# Patient Record
Sex: Female | Born: 1980
Health system: Southern US, Community
[De-identification: ages and names within clinical notes are randomized; demographics above are authoritative.]

## PROBLEM LIST (undated history)

## (undated) DIAGNOSIS — M1990 Unspecified osteoarthritis, unspecified site: Secondary | ICD-10-CM

## (undated) DIAGNOSIS — N809 Endometriosis, unspecified: Secondary | ICD-10-CM

## (undated) DIAGNOSIS — D649 Anemia, unspecified: Secondary | ICD-10-CM

## (undated) DIAGNOSIS — R102 Pelvic and perineal pain: Secondary | ICD-10-CM

## (undated) DIAGNOSIS — R112 Nausea with vomiting, unspecified: Secondary | ICD-10-CM

## (undated) DIAGNOSIS — E538 Deficiency of other specified B group vitamins: Secondary | ICD-10-CM

## (undated) DIAGNOSIS — G43909 Migraine, unspecified, not intractable, without status migrainosus: Secondary | ICD-10-CM

## (undated) DIAGNOSIS — Z9889 Other specified postprocedural states: Secondary | ICD-10-CM

## (undated) DIAGNOSIS — E559 Vitamin D deficiency, unspecified: Secondary | ICD-10-CM

## (undated) DIAGNOSIS — T7840XA Allergy, unspecified, initial encounter: Secondary | ICD-10-CM

## (undated) DIAGNOSIS — M6702 Short Achilles tendon (acquired), left ankle: Secondary | ICD-10-CM

## (undated) DIAGNOSIS — F419 Anxiety disorder, unspecified: Secondary | ICD-10-CM

## (undated) DIAGNOSIS — M069 Rheumatoid arthritis, unspecified: Secondary | ICD-10-CM

## (undated) HISTORY — DX: Allergy, unspecified, initial encounter: T78.40XA

## (undated) HISTORY — DX: Vitamin D deficiency, unspecified: E55.9

## (undated) HISTORY — DX: Endometriosis, unspecified: N80.9

## (undated) HISTORY — DX: Deficiency of other specified B group vitamins: E53.8

## (undated) HISTORY — PX: DILATION AND CURETTAGE OF UTERUS: SHX78

## (undated) HISTORY — DX: Unspecified osteoarthritis, unspecified site: M19.90

---

## 2001-05-26 ENCOUNTER — Inpatient Hospital Stay (HOSPITAL_COMMUNITY): Admission: AD | Admit: 2001-05-26 | Discharge: 2001-05-26 | Payer: Self-pay | Admitting: Obstetrics

## 2001-05-26 ENCOUNTER — Encounter: Payer: Self-pay | Admitting: Obstetrics

## 2001-05-28 ENCOUNTER — Inpatient Hospital Stay (HOSPITAL_COMMUNITY): Admission: AD | Admit: 2001-05-28 | Discharge: 2001-05-28 | Payer: Self-pay | Admitting: Obstetrics

## 2001-05-29 ENCOUNTER — Inpatient Hospital Stay (HOSPITAL_COMMUNITY): Admission: AD | Admit: 2001-05-29 | Discharge: 2001-05-29 | Payer: Self-pay | Admitting: *Deleted

## 2001-06-05 ENCOUNTER — Inpatient Hospital Stay (HOSPITAL_COMMUNITY): Admission: AD | Admit: 2001-06-05 | Discharge: 2001-06-05 | Payer: Self-pay | Admitting: *Deleted

## 2001-06-05 ENCOUNTER — Encounter: Payer: Self-pay | Admitting: *Deleted

## 2001-06-07 ENCOUNTER — Inpatient Hospital Stay (HOSPITAL_COMMUNITY): Admission: AD | Admit: 2001-06-07 | Discharge: 2001-06-07 | Payer: Self-pay | Admitting: Obstetrics & Gynecology

## 2001-06-13 ENCOUNTER — Inpatient Hospital Stay (HOSPITAL_COMMUNITY): Admission: AD | Admit: 2001-06-13 | Discharge: 2001-06-13 | Payer: Self-pay | Admitting: Obstetrics

## 2001-10-16 ENCOUNTER — Encounter: Payer: Self-pay | Admitting: *Deleted

## 2001-10-16 ENCOUNTER — Inpatient Hospital Stay (HOSPITAL_COMMUNITY): Admission: AD | Admit: 2001-10-16 | Discharge: 2001-10-16 | Payer: Self-pay | Admitting: *Deleted

## 2001-10-18 ENCOUNTER — Inpatient Hospital Stay (HOSPITAL_COMMUNITY): Admission: AD | Admit: 2001-10-18 | Discharge: 2001-10-18 | Payer: Self-pay | Admitting: *Deleted

## 2002-05-23 ENCOUNTER — Other Ambulatory Visit: Admission: RE | Admit: 2002-05-23 | Discharge: 2002-05-23 | Payer: Self-pay | Admitting: Obstetrics and Gynecology

## 2002-05-23 ENCOUNTER — Other Ambulatory Visit: Admission: RE | Admit: 2002-05-23 | Discharge: 2002-05-23 | Payer: Self-pay | Admitting: *Deleted

## 2002-05-24 ENCOUNTER — Ambulatory Visit (HOSPITAL_COMMUNITY): Admission: RE | Admit: 2002-05-24 | Discharge: 2002-05-24 | Payer: Self-pay | Admitting: Obstetrics and Gynecology

## 2002-05-24 ENCOUNTER — Encounter: Payer: Self-pay | Admitting: Obstetrics and Gynecology

## 2002-06-19 ENCOUNTER — Encounter: Payer: Self-pay | Admitting: Obstetrics and Gynecology

## 2002-06-19 ENCOUNTER — Ambulatory Visit (HOSPITAL_COMMUNITY): Admission: RE | Admit: 2002-06-19 | Discharge: 2002-06-19 | Payer: Self-pay | Admitting: Obstetrics and Gynecology

## 2002-07-22 ENCOUNTER — Ambulatory Visit (HOSPITAL_COMMUNITY): Admission: RE | Admit: 2002-07-22 | Discharge: 2002-07-22 | Payer: Self-pay | Admitting: Obstetrics and Gynecology

## 2002-07-22 ENCOUNTER — Encounter: Payer: Self-pay | Admitting: Obstetrics and Gynecology

## 2002-09-19 ENCOUNTER — Ambulatory Visit (HOSPITAL_COMMUNITY): Admission: RE | Admit: 2002-09-19 | Discharge: 2002-09-19 | Payer: Self-pay | Admitting: Obstetrics and Gynecology

## 2002-09-19 ENCOUNTER — Encounter: Payer: Self-pay | Admitting: Obstetrics and Gynecology

## 2002-11-15 ENCOUNTER — Encounter: Payer: Self-pay | Admitting: Obstetrics and Gynecology

## 2002-11-15 ENCOUNTER — Encounter (INDEPENDENT_AMBULATORY_CARE_PROVIDER_SITE_OTHER): Payer: Self-pay | Admitting: *Deleted

## 2002-11-15 ENCOUNTER — Inpatient Hospital Stay (HOSPITAL_COMMUNITY): Admission: AD | Admit: 2002-11-15 | Discharge: 2002-11-18 | Payer: Self-pay | Admitting: Obstetrics and Gynecology

## 2004-05-31 ENCOUNTER — Ambulatory Visit: Payer: Self-pay | Admitting: Family Medicine

## 2004-06-02 ENCOUNTER — Ambulatory Visit: Payer: Self-pay | Admitting: Family Medicine

## 2004-07-08 ENCOUNTER — Other Ambulatory Visit: Admission: RE | Admit: 2004-07-08 | Discharge: 2004-07-08 | Payer: Self-pay | Admitting: Family Medicine

## 2004-07-08 ENCOUNTER — Ambulatory Visit: Payer: Self-pay | Admitting: Family Medicine

## 2004-09-07 ENCOUNTER — Ambulatory Visit: Payer: Self-pay | Admitting: Family Medicine

## 2004-09-13 ENCOUNTER — Inpatient Hospital Stay (HOSPITAL_COMMUNITY): Admission: AD | Admit: 2004-09-13 | Discharge: 2004-09-13 | Payer: Self-pay | Admitting: Obstetrics and Gynecology

## 2004-09-16 ENCOUNTER — Inpatient Hospital Stay (HOSPITAL_COMMUNITY): Admission: AD | Admit: 2004-09-16 | Discharge: 2004-09-16 | Payer: Self-pay | Admitting: Obstetrics and Gynecology

## 2004-09-20 ENCOUNTER — Inpatient Hospital Stay (HOSPITAL_COMMUNITY): Admission: AD | Admit: 2004-09-20 | Discharge: 2004-09-20 | Payer: Self-pay | Admitting: *Deleted

## 2005-03-01 ENCOUNTER — Ambulatory Visit (HOSPITAL_COMMUNITY): Admission: RE | Admit: 2005-03-01 | Discharge: 2005-03-01 | Payer: Self-pay | Admitting: Obstetrics and Gynecology

## 2005-04-07 ENCOUNTER — Inpatient Hospital Stay (HOSPITAL_COMMUNITY): Admission: AD | Admit: 2005-04-07 | Discharge: 2005-04-07 | Payer: Self-pay | Admitting: Obstetrics and Gynecology

## 2005-05-19 ENCOUNTER — Inpatient Hospital Stay (HOSPITAL_COMMUNITY): Admission: AD | Admit: 2005-05-19 | Discharge: 2005-05-21 | Payer: Self-pay | Admitting: Obstetrics and Gynecology

## 2005-06-29 ENCOUNTER — Other Ambulatory Visit: Admission: RE | Admit: 2005-06-29 | Discharge: 2005-06-29 | Payer: Self-pay | Admitting: Obstetrics and Gynecology

## 2005-07-27 ENCOUNTER — Ambulatory Visit (HOSPITAL_COMMUNITY): Admission: RE | Admit: 2005-07-27 | Discharge: 2005-07-27 | Payer: Self-pay | Admitting: Obstetrics and Gynecology

## 2005-07-27 HISTORY — PX: TUBAL LIGATION: SHX77

## 2007-09-13 ENCOUNTER — Emergency Department (HOSPITAL_COMMUNITY): Admission: EM | Admit: 2007-09-13 | Discharge: 2007-09-13 | Payer: Self-pay | Admitting: Family Medicine

## 2007-12-21 ENCOUNTER — Emergency Department (HOSPITAL_COMMUNITY): Admission: EM | Admit: 2007-12-21 | Discharge: 2007-12-21 | Payer: Self-pay | Admitting: Emergency Medicine

## 2008-01-28 ENCOUNTER — Encounter (INDEPENDENT_AMBULATORY_CARE_PROVIDER_SITE_OTHER): Payer: Self-pay | Admitting: Family Medicine

## 2008-01-28 ENCOUNTER — Ambulatory Visit: Payer: Self-pay | Admitting: Family Medicine

## 2008-01-28 DIAGNOSIS — A5901 Trichomonal vulvovaginitis: Secondary | ICD-10-CM | POA: Insufficient documentation

## 2008-01-28 LAB — CONVERTED CEMR LAB

## 2008-01-30 ENCOUNTER — Telehealth (INDEPENDENT_AMBULATORY_CARE_PROVIDER_SITE_OTHER): Payer: Self-pay | Admitting: Family Medicine

## 2008-02-13 ENCOUNTER — Encounter (INDEPENDENT_AMBULATORY_CARE_PROVIDER_SITE_OTHER): Payer: Self-pay | Admitting: Family Medicine

## 2008-02-28 ENCOUNTER — Encounter (INDEPENDENT_AMBULATORY_CARE_PROVIDER_SITE_OTHER): Payer: Self-pay | Admitting: Family Medicine

## 2008-02-28 ENCOUNTER — Emergency Department (HOSPITAL_COMMUNITY): Admission: EM | Admit: 2008-02-28 | Discharge: 2008-02-28 | Payer: Self-pay | Admitting: Family Medicine

## 2008-02-29 ENCOUNTER — Telehealth (INDEPENDENT_AMBULATORY_CARE_PROVIDER_SITE_OTHER): Payer: Self-pay | Admitting: Family Medicine

## 2008-03-06 ENCOUNTER — Encounter (INDEPENDENT_AMBULATORY_CARE_PROVIDER_SITE_OTHER): Payer: Self-pay | Admitting: Family Medicine

## 2008-03-07 ENCOUNTER — Emergency Department (HOSPITAL_COMMUNITY): Admission: EM | Admit: 2008-03-07 | Discharge: 2008-03-07 | Payer: Self-pay | Admitting: Family Medicine

## 2008-03-07 ENCOUNTER — Encounter (INDEPENDENT_AMBULATORY_CARE_PROVIDER_SITE_OTHER): Payer: Self-pay | Admitting: Family Medicine

## 2008-03-08 ENCOUNTER — Telehealth (INDEPENDENT_AMBULATORY_CARE_PROVIDER_SITE_OTHER): Payer: Self-pay | Admitting: Family Medicine

## 2008-03-12 ENCOUNTER — Ambulatory Visit: Payer: Self-pay | Admitting: Family Medicine

## 2008-03-12 ENCOUNTER — Telehealth (INDEPENDENT_AMBULATORY_CARE_PROVIDER_SITE_OTHER): Payer: Self-pay | Admitting: *Deleted

## 2008-03-12 DIAGNOSIS — M069 Rheumatoid arthritis, unspecified: Secondary | ICD-10-CM | POA: Insufficient documentation

## 2008-03-12 DIAGNOSIS — M25549 Pain in joints of unspecified hand: Secondary | ICD-10-CM | POA: Insufficient documentation

## 2008-03-17 ENCOUNTER — Ambulatory Visit (HOSPITAL_COMMUNITY): Admission: RE | Admit: 2008-03-17 | Discharge: 2008-03-17 | Payer: Self-pay | Admitting: Family Medicine

## 2008-03-18 ENCOUNTER — Encounter (INDEPENDENT_AMBULATORY_CARE_PROVIDER_SITE_OTHER): Payer: Self-pay | Admitting: Family Medicine

## 2008-03-19 ENCOUNTER — Telehealth (INDEPENDENT_AMBULATORY_CARE_PROVIDER_SITE_OTHER): Payer: Self-pay | Admitting: *Deleted

## 2008-03-19 ENCOUNTER — Encounter: Payer: Self-pay | Admitting: Family Medicine

## 2008-03-19 ENCOUNTER — Emergency Department (HOSPITAL_COMMUNITY): Admission: EM | Admit: 2008-03-19 | Discharge: 2008-03-19 | Payer: Self-pay | Admitting: Emergency Medicine

## 2008-03-26 ENCOUNTER — Telehealth (INDEPENDENT_AMBULATORY_CARE_PROVIDER_SITE_OTHER): Payer: Self-pay | Admitting: Family Medicine

## 2008-03-28 ENCOUNTER — Encounter (INDEPENDENT_AMBULATORY_CARE_PROVIDER_SITE_OTHER): Payer: Self-pay | Admitting: Family Medicine

## 2008-03-31 ENCOUNTER — Ambulatory Visit: Payer: Self-pay | Admitting: Family Medicine

## 2008-03-31 DIAGNOSIS — M25569 Pain in unspecified knee: Secondary | ICD-10-CM

## 2008-04-07 ENCOUNTER — Telehealth (INDEPENDENT_AMBULATORY_CARE_PROVIDER_SITE_OTHER): Payer: Self-pay | Admitting: Family Medicine

## 2008-04-10 LAB — CONVERTED CEMR LAB
ALT: 21 units/L (ref 0–35)
AST: 14 units/L (ref 0–37)
Alkaline Phosphatase: 85 units/L (ref 39–117)
BUN: 18 mg/dL (ref 6–23)
CO2: 22 meq/L (ref 19–32)
Calcium: 9.2 mg/dL (ref 8.4–10.5)
Creatinine, Ser: 0.58 mg/dL (ref 0.40–1.20)
Eosinophils Relative: 1 % (ref 0–5)
Glucose, Bld: 76 mg/dL (ref 70–99)
HCT: 35.6 % — ABNORMAL LOW (ref 36.0–46.0)
Hemoglobin: 10.6 g/dL — ABNORMAL LOW (ref 12.0–15.0)
Hep B S Ab: NEGATIVE
Hepatitis B Surface Ag: NEGATIVE
MCHC: 29.8 g/dL — ABNORMAL LOW (ref 30.0–36.0)
MCV: 81.1 fL (ref 78.0–100.0)
Neutro Abs: 9.7 10*3/uL — ABNORMAL HIGH (ref 1.7–7.7)
Neutrophils Relative %: 73 % (ref 43–77)
Platelets: 549 10*3/uL — ABNORMAL HIGH (ref 150–400)
Potassium: 4.9 meq/L (ref 3.5–5.3)
RBC: 4.39 M/uL (ref 3.87–5.11)
RDW: 14.2 % (ref 11.5–15.5)
WBC: 13.2 10*3/uL — ABNORMAL HIGH (ref 4.0–10.5)

## 2008-04-16 ENCOUNTER — Telehealth (INDEPENDENT_AMBULATORY_CARE_PROVIDER_SITE_OTHER): Payer: Self-pay | Admitting: Family Medicine

## 2008-04-28 ENCOUNTER — Encounter (INDEPENDENT_AMBULATORY_CARE_PROVIDER_SITE_OTHER): Payer: Self-pay | Admitting: Family Medicine

## 2008-06-30 ENCOUNTER — Encounter (INDEPENDENT_AMBULATORY_CARE_PROVIDER_SITE_OTHER): Payer: Self-pay | Admitting: Family Medicine

## 2008-09-02 ENCOUNTER — Encounter (INDEPENDENT_AMBULATORY_CARE_PROVIDER_SITE_OTHER): Payer: Self-pay | Admitting: Family Medicine

## 2008-09-09 IMAGING — CR DG HAND 3V BILAT
6 series · 6 of 6 positions shown · non-contrast
Comparison: None

CLINICAL DATA: Rheumatoid arthritis

BILATERAL HAND - 3 VIEW

[x hand pa left]
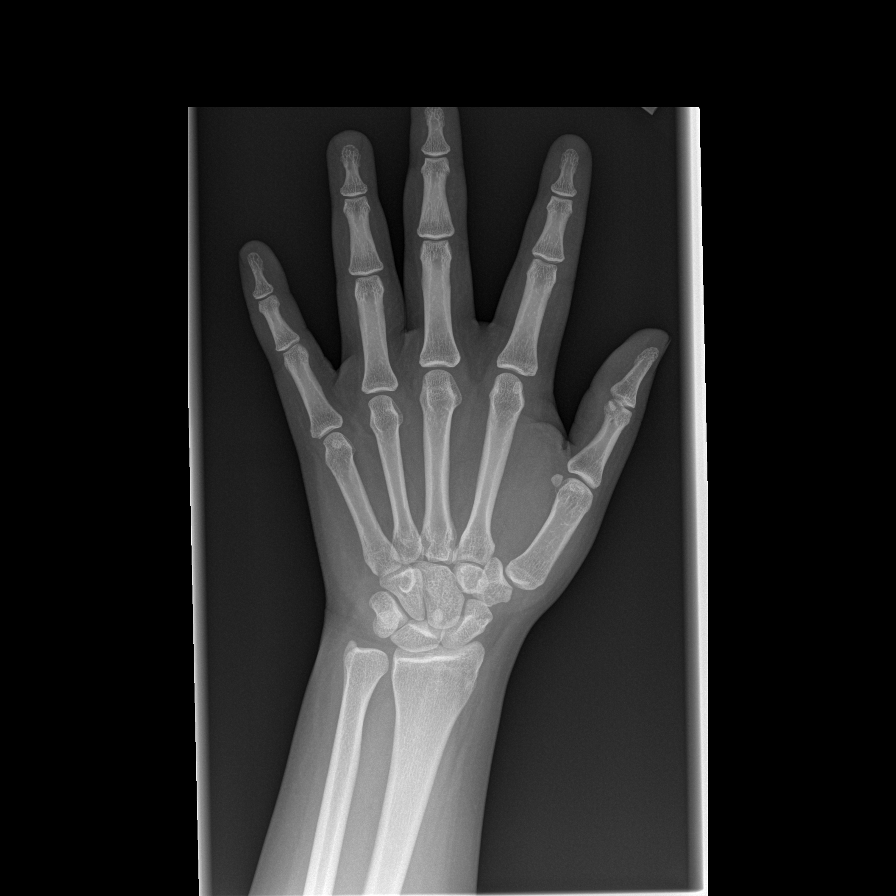

[x hand oblique left]
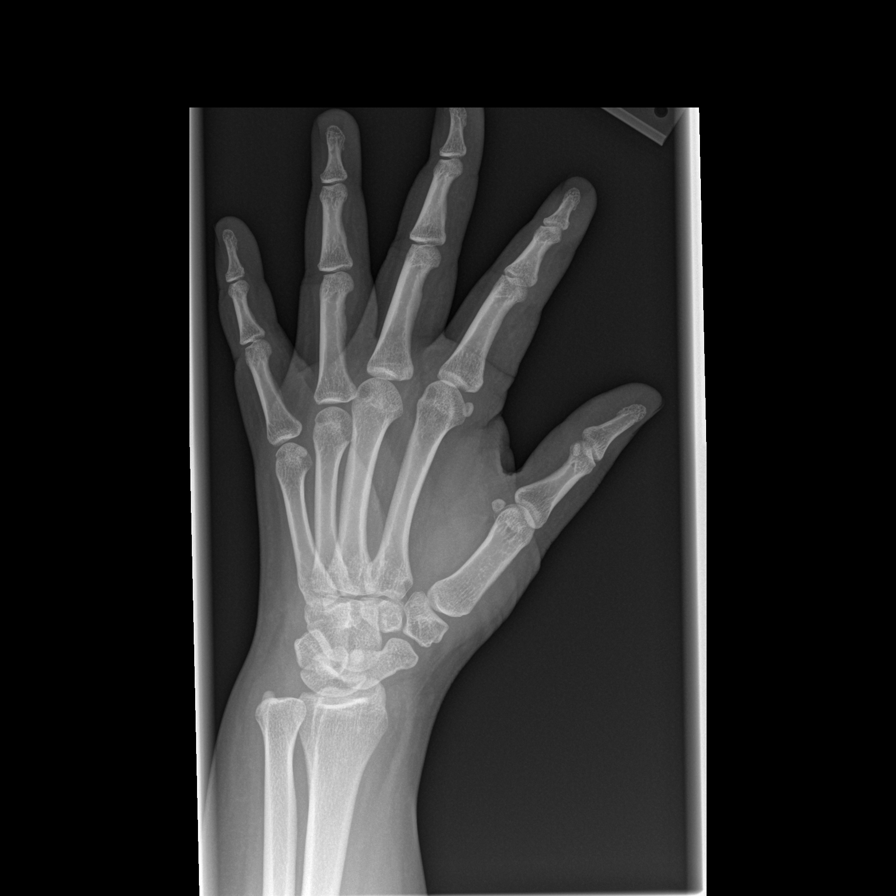

[x hand lat left]
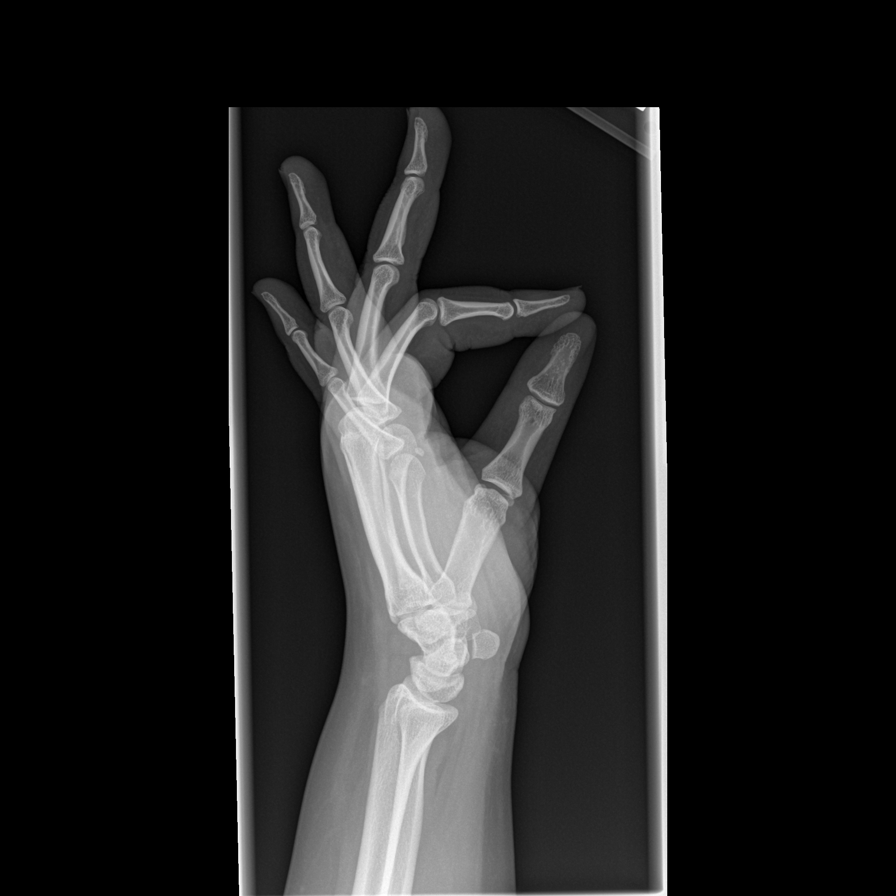

[x hand pa right]
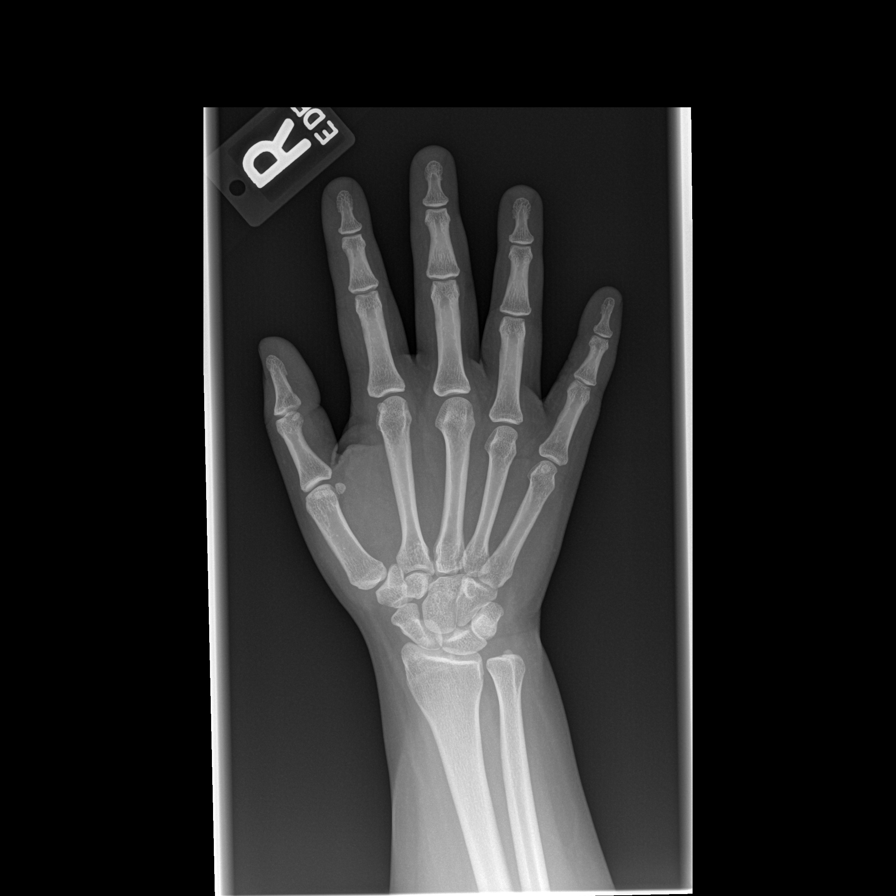

[x hand oblique right]
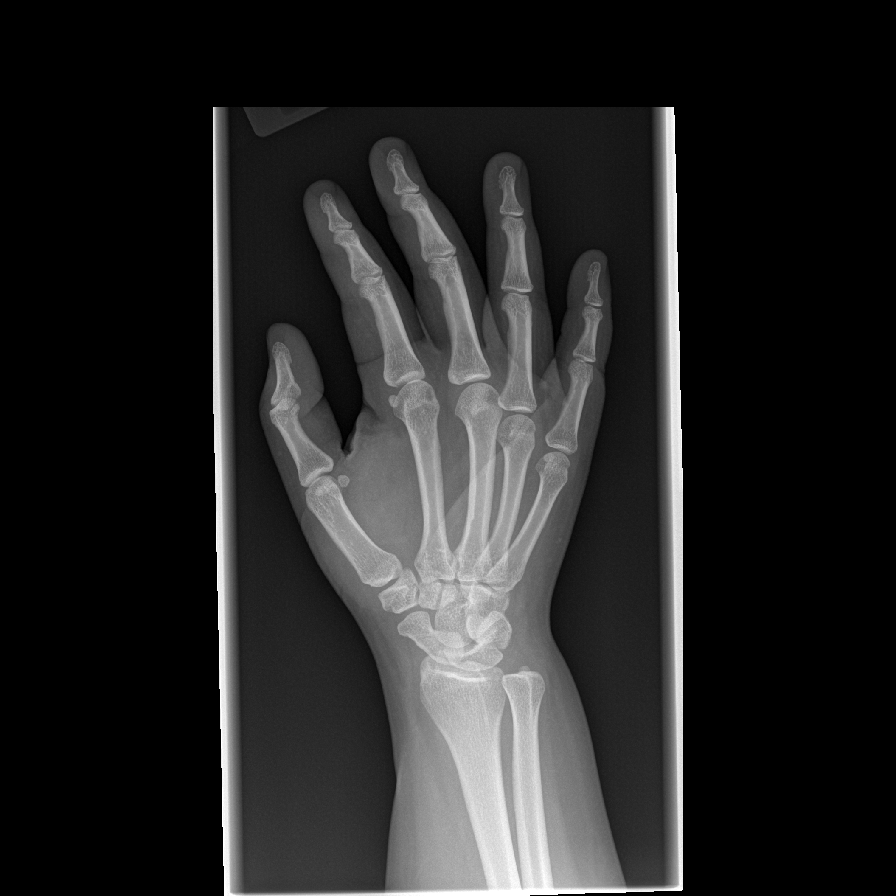

[x hand lat right]
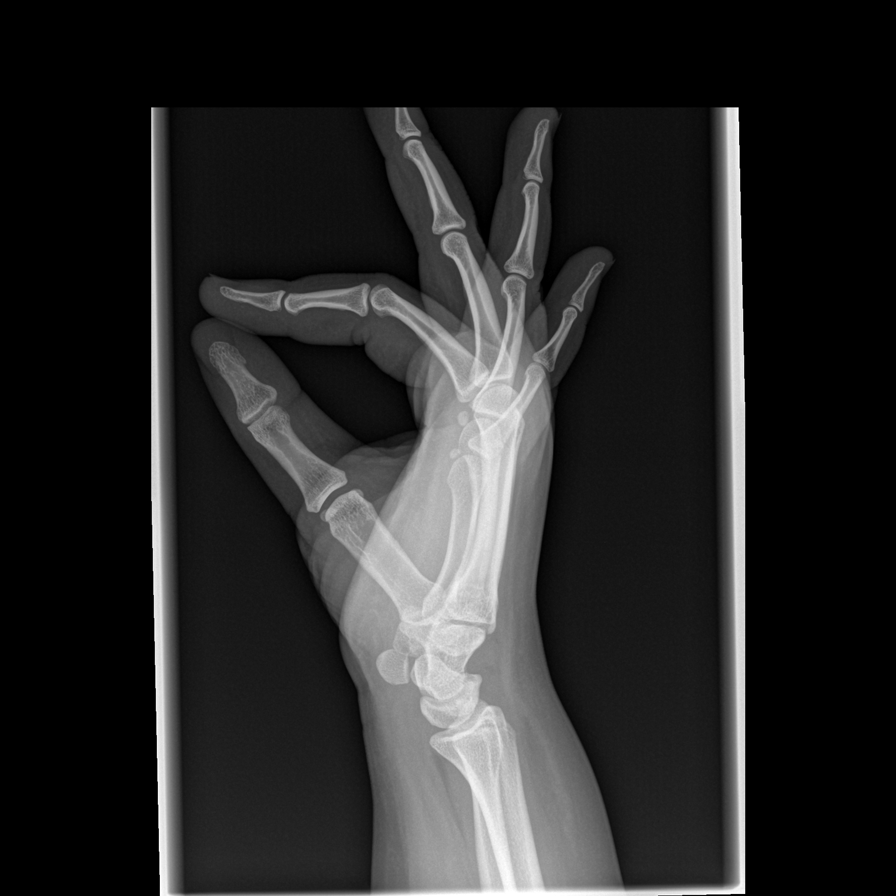

[6 of 6 positions shown; findings below may reference images not displayed]

FINDINGS: The joint spaces are maintained.  No degenerative changes
or arthropathic changes are evident.  Mineralization appears
relatively normal.
IMPRESSION: 1.  No plain film findings to suggest an inflammatory arthropathy.

## 2009-01-13 ENCOUNTER — Encounter (INDEPENDENT_AMBULATORY_CARE_PROVIDER_SITE_OTHER): Payer: Self-pay | Admitting: Nurse Practitioner

## 2010-12-03 NOTE — Op Note (Signed)
NAME:  Daisy [name], Daisy [name]                           ACCOUNT NO.:  [id number]   MEDICAL RECORD NO.:  [id number]                   PATIENT TYPE:  INP   LOCATION:  9125                                 [facility]ILITY:  [facility]   PHYSICIAN:  Janine Limbo, M.D.            DATE OF BIRTH:  05/20/1981   DATE OF PROCEDURE:  11/16/2002  DATE OF DISCHARGE:                                 OPERATIVE REPORT   PREOPERATIVE DIAGNOSES:  1. Forty-one weeks' gestation.  2. Bleeding during the second stage of labor.  3. Nonreassuring fetal heart rate tracing.   POSTOPERATIVE DIAGNOSES:  1. Forty-one weeks' gestation.  2. Bleeding during the second stage of labor.  3. Nonreassuring fetal heart rate tracing.   PROCEDURE:  Stat low transverse cesarean section.   SURGEON:  Janine Limbo, M.D.   ASSISTANT:  Concha Pyo. Duplantis, C.N.M.   ANESTHESIA:  Epidural.   DISPOSITION:  The patient is a 30 year old female, gravida 4, para 1-0-2-1,  who presented at 60 weeks' gestation by her earliest ultrasound.  She  dilated her cervix to 7 cm and a -1 station.  She began having variable and  late decelerations.  An amnioinfusion was begun and there was partial  resolution of the fetal heart rate pattern.  However, the patient then began  having more persistent decelerations. The patient was asked to push, and she  actually dilated her cervix to 9 cm and a +1 to +2 station.  The fetal heart  rate deceleration continued.  The patient was allowed to push several more  times but was unable to dilate the cervix completely, and the fetal head did  not descend any further.  We decided to proceed with emergency cesarean  delivery.  We did review the risks associated with cesarean section,  including, but not limited to, anesthetic complications, bleeding,  infection, and possible damage to the surrounding organs.   FINDINGS:  A 7 pound 9 ounce female infant (Amaree) was delivered from a  cephalic presentation.  The  Apgars were 8 at one minute and 9 at five  minutes.  The arterial cord blood pH was 7.19.  The patient was noted to  have a fibroid on the anterior uterus.  It measured approximately 4 cm in  size.  The fallopian tubes and the ovaries appeared normal.   DESCRIPTION OF PROCEDURE:  The patient was taken to the operating room in an  emergent fashion.  The patient's abdomen and perineum were prepped with  multiple layers of Betadine.  A Foley catheter was placed in the bladder.  The patient was sterilely draped.  A low transverse incision was made in the  abdomen and carried sharply through the subcutaneous tissue, the fascia, and  the anterior peritoneum.  An incision was made in the lower uterine segment  and extended transversely.  The fetal head was delivered.  The mouth and  nose were  suctioned.  The cord was clamped and cut and the infant was handed  to the awaiting pediatric team.  Routine cord blood studies were obtained.  The placenta was removed.  The placenta was sent to pathology.  The uterine  cavity was cleaned of amniotic fluid, clotted blood, and membranes.  The  uterine incision was closed using a running locking suture of 2-0 Vicryl,  followed by an imbricating suture of 2-0 Vicryl.  Hemostasis was noted to be  adequate.  The abdominal cavity was then vigorously irrigated.  Again  hemostasis was confirmed.  The abdominal musculature and the anterior  peritoneum were then closed in the midline.  Vicryl 2-0 was used.  The  fascia and the subcutaneous layer were irrigated.  The fascia was closed  using a running suture of 0 Vicryl, followed by three interrupted sutures of  0 Vicryl.  The subcutaneous layer was irrigated.  A Jackson-Pratt drain was  placed in the subcutaneous space and brought out through the left lower  quadrant.  The Jackson-Pratt drain was sutured into place using 3-0 silk.  The subcutaneous layer was closed using interrupted sutures of 2-0 Vicryl.  The skin  was reapproximated using skin staples.  Sponge, needle, and  instrument  counts were correct on two occasions.  The estimated blood loss  was 1000 mL.  The patient tolerated her procedure well.  The patient was  taken to the recovery room in stable condition.  The infant was taken to the  full-term nursery in stable condition.  There was no obvious etiology for  the fetal distress.                                               Janine Limbo, M.D.    AVS/MEDQ  D:  11/15/2002  T:  11/16/2002  Job:  [id number]

## 2010-12-03 NOTE — Discharge Summary (Signed)
NAME:  [name], [name]                           ACCOUNT NO.:  [id number]   MEDICAL RECORD NO.:  [id number]                   PATIENT TYPE:  INP   LOCATION:  9125                                 [facility]ILITY[facility]   PHYSICIAN:  Naima A. Dillard, M.D.              DATE OF BIRTH:  [birth year]   DATE OF ADMISSION:  11/15/2002  DATE OF DISCHARGE:  11/18/2002                                 DISCHARGE SUMMARY   ADMISSION DIAGNOSES:  1. Intrauterine pregnancy at term.  2. Positive group B strep.  3. Early labor.   DISCHARGE DIAGNOSES:  1. Intrauterine pregnancy at term.  2. Positive group B strep.  3. Early labor.  4. Nonreassuring fetal heart rate status post stat cesarean section.  5. Second stage vaginal bleeding.  6. Bottle feeding and desires Depo-Provera for contraception.   PROCEDURE THIS ADMISSION:  A stat cesarean section for delivery on November 15, 2002 attended by Janine Limbo, M.D. and Concha Pyo. Duplantis, C.N.M.  with delivery of a viable female infant named Amarie who weighed 7 pounds 9  ounces and had Apgars of 8/9 and a cord pH of 7.19.   HOSPITAL COURSE:  The patient is a 30 year old single black female gravida  4, para 1-0-2-1 at 39-5/7 weeks by LMP and 41-1/7 weeks by 16-week  ultrasound who presented having uterine contractions every 7 minutes  throughout the morning.  She was admitted with a favorable cervix at 3-4 cm,  80%, vertex, minus 2 in mid position and progressed to 7-8 cm by 3:30 p.m.  and at that point had had occasional late decelerations.  She was given an  amnioinfusion to try to resolve the late decelerations.  However, they  persisted and a cesarean section as a result.  They actually reoccurred  later on and she was closer to being able to push and tried to push but had  significant second stage bleeding and persistent nonreassuring fetal heart  rate tracing at which time a decision was made to go for a stat cesarean  section in which she underwent  delivery of a viable female infant named Amarie  who weighed 7 pounds ounces and had Apgars of 8/9 with a cord pH of 7.19 on  November 15, 2002 attended by Janine Limbo, M.D. and Concha Pyo.  Duplantis, C.N.M.  Postoperatively, the patient had done well.  She is  ambulating, voiding, and eating without difficulty.  Her vital signs have  been stable and she has been afebrile throughout her hospital stay.  She did  have one temp of 100.4 in the a.m. following delivery.  She is desirous of  Depo-Provera for contraception and will receive that prior to her discharge  home.  She is deemed ready for discharge today.   DISCHARGE INSTRUCTIONS:  As per the [facility] OB/GYN handout.   DISCHARGE MEDICATIONS:  1. Motrin 600 mg p.o. q.6  h. p.r.n. for pain.  2. Darvocet-N 100 one to two p.o. q.4-6 h. p.r.n. for pain.  3. Prenatal vitamins daily.  4. Depo-Provera 150 mg IM prior to discharge.    DISCHARGE LABORATORY DATA:  Hemoglobin 10.2, WBC count 16.2 and her  platelets are 213,000.   FOLLOW UP:  Her discharge follow up will be in six weeks at [facility]  OB/GYN __________.     Concha Pyo. Duplantis, C.N.M.              Naima A. Normand Sloop, M.D.    SJD/MEDQ  D:  11/18/2002  T:  11/18/2002  Job:  [id number]

## 2010-12-03 NOTE — H&P (Signed)
NAME:  [name], [name]                           ACCOUNT NO.:  [id number]   MEDICAL RECORD NO.:  [id number]                   PATIENT TYPE:  INP   LOCATION:  9174                                 [facility]   PHYSICIAN:  Janine Limbo, M.D.            DATE OF BIRTH:  [birth year]   DATE OF ADMISSION:  11/15/2002  DATE OF DISCHARGE:                                HISTORY & PHYSICAL   HISTORY OF PRESENT ILLNESS:  The patient is a 30 year old single black  female, gravida 4, para 1-0-2-1, at 39-5/7 weeks by LMP and 41-1/7 weeks by  ultrasound which has been her primary criteria for gestational age  throughout this pregnancy. She presents complaining of uterine contractions  every seven minutes all morning, since about 5 a.m. and denies any leaking  or bleeding.  She does report positive fetal movement. She denies any  nausea, vomiting, headaches, or visual disturbances.  Her pregnancy has been  followed at [facility] by the M.D. service and has been essentially  uncomplicated, though, at risk for history of obesity, history of abnormal  Pap with cryosurgery, history of STD's, and history of migraines.  Her Group  B Strep is also positive.   PAST OBSTETRICAL HISTORY:  She is a gravida 4, para 1-0-2-1, who delivered a  viable female infant in May of 1996 who weighed 6 pounds 12 ounces at [redacted]  weeks gestation following an 8-hour labor. She delivered vaginally with  epidural for anesthesia.  In November of 2002 she had a miscarriage.  In  April of 2003 she had a miscarriage. Both without complications.  She has  had an abnormal Pap in 1996 and had a colposcopy and cryosurgery and Paps  have been normal since that time. She had Depo-Provera until 1999.   ALLERGIES:  CODEINE.   PAST MEDICAL HISTORY:  She reports having had the usual childhood diseases.  She has occasional urinary tract infections and occasional migraines.   FAMILY HISTORY:  Significant for mother and maternal  aunt with chronic  hypertension.  Maternal great aunt with diabetes.  Cousin with dialysis and  lupus. Maternal cousin with breast cancer and seizures. Genetic history is  negative.   SOCIAL HISTORY:  She is single. The father of the baby is Daisy [name].  He is supportive.  She also has a supportive family.  She is high school  educated and currently unemployed. She is of the [employer]. She denies  any illicit drug use, alcohol, or smoking with this pregnancy.   PRENATAL LABORATORY DATA:  Her blood type is O positive, antibody screen is  negative, sickle cell trait is negative, syphilis is nonreactive, rubella  positive, hepatitis B surface antigen negative, HIV nonreactive. GC and  Chlamydia are both negative. Pap was within normal limits. Cystic fibrosis  screening was negative. One-hour Glucola was 77.  Maternal serum alpha  fetoprotein was within normal  range and a 36-week Beta Strep was positive.   PHYSICAL EXAMINATION:  VITAL SIGNS: Stable, she is afebrile.  HEENT:  Grossly within normal limits.  HEART:  Regular rate and rhythm.  CHEST:  Clear.  BREASTS: Soft and nontender.  ABDOMEN:  Gravid with uterine contractions that are irregular and mild every  10 to 25 minutes. Her fetal heart rate has overall reactivity and  reassurance, but has had two variable decelerations that appear late in  recovery.  PELVIC:  Cervix is 3 to 4 cm, 80%, and vertex -2, midposition, and soft.  EXTREMITIES:  Within normal limits.   ASSESSMENT:  1. Intrauterine pregnancy at term.  2. Positive Group B Strep.  3. Early labor.  4. Post dates for large for gestational age.   PLAN:  Admit to labor and delivery for consult with Dr. Stefano Gaul who plans  to artificially rupture her membranes for augmentation, to give her  penicillin for Group B Strep, and to follow routine M.D. orders.     Concha Pyo. Duplantis, C.N.M.              Janine Limbo, M.D.    SJD/MEDQ  D:  11/15/2002  T:   11/15/2002  Job:  [id number]

## 2010-12-03 NOTE — H&P (Signed)
NAME:  Daisy [name], Daisy [name]                 ACCOUNT NO.:  [id number]   MEDICAL RECORD NO.:  [id number]          PATIENT TYPE:  MAT   LOCATION:  MATC                          [facility]   PHYSICIAN:  Crist Fat. Rivard, M.D. DATE OF BIRTH:  12/04/1980   DATE OF ADMISSION:  05/19/2005  DATE OF DISCHARGE:                                HISTORY & PHYSICAL   This is a 30 year old gravida 5, para 2-0-2-2 at 40-3/7 weeks who presents  unannounced with complaints of contractions since 2 a.m., worsening since 6  a.m.  She denies leaking or bleeding and reports positive fetal movement.  Pregnancy has been followed by Dr. Stefano Gaul and remarkable for previous  cesarean section on the second baby, history of cryo surgery in 1996,  obesity, previous smoker, third trimester Chlamydia.   OB HISTORY:  Remarkable for vaginal delivery in 1996 of a female infant at  [redacted] weeks gestation weighing 6 pounds 12 ounces.  She had a spontaneous  abortion in 2002 and 2003 and she had a low transverse cesarean section in  2004 of a female infant at [redacted] weeks gestation weighing 7 pounds 9 ounces  remarkable for nonreassuring fetal heart rate tracing and bleeding with  labor.   MEDICAL HISTORY:  Remarkable for history of abnormal Pap with cryo surgery  in 1996.  She had Chlamydia in 2002 and again in the third trimester of this  pregnancy and patient is a previous smoker.  She quit when she became  pregnant.   FAMILY HISTORY:  Remarkable for mother, aunt, and grandmother with  hypertension, cousin with diabetes, grandfather with Alzheimer's, cousin  with breast cancer, and another cousin with lung cancer.   GENETIC HISTORY:  Remarkable for father of the baby's aunt with twins, an  uncle with triplets.   SOCIAL HISTORY:  Patient is single.  Father of the baby, Jamal Collin,  is involved, but not currently present.  She is of the [employer].  She  denies any alcohol, tobacco, or drug use.   PRENATAL LABORATORIES:   Hemoglobin 12.3, platelets 343.  Blood type O+.  Antibody screen negative.  Sickle cell negative.  RPR nonreactive.  Rubella  immune.  Hepatitis negative.  HIV negative.  Cystic fibrosis negative.   ALLERGIES:  CODEINE.   HISTORY OF CURRENT PREGNANCY:  Patient entered care at [redacted] weeks gestation.  Ultrasound was done which was normal.  She declined a quad screen.  She  received a VBAC consent form early in pregnancy.  Glucola was normal in mid  pregnancy.  At 31 weeks she expressed interest in postpartum tubal ligation  and papers were signed.  She had positive Chlamydia at 35 weeks which was  treated with Zithromax and test of cure was negative.  In the third  trimester she mentioned an allergy to CODEINE that she had not previously  mentioned.   OBJECTIVE:  VITAL SIGNS:  Stable, afebrile.  HEENT:  Within normal limits.  Thyroid normal, not enlarged.  CHEST:  Clear to auscultation.  HEART:  Rate regular.  ABDOMEN:  Gravid, 39 cm, vertex, Leopold's.  EFM shows reactive fetal heart  rate with contractions every three to five minutes.  PELVIC:  Cervix is 4-5 cm, 90% effaced, -2 station with a vertex  presentation.  EXTREMITIES:  Within normal limits.   ASSESSMENT:  1.  Intrauterine pregnancy at 40-3/7 weeks.  2.  Active labor.   PLAN:  1.  Admit to birthing suites per Dr. Estanislado Pandy.  2.  Routine M.D. orders.  3.  Epidural p.r.n.      Marie L. Williams, C.N.M.      Crist Fat Rivard, M.D.  Electronically Signed    MLW/MEDQ  D:  05/19/2005  T:  05/19/2005  Job:  [id number]

## 2010-12-03 NOTE — Op Note (Signed)
NAME:  Daisy [name], Daisy [name]                 ACCOUNT NO.:  [id number]   MEDICAL RECORD NO.:  [id number]          PATIENT TYPE:  AMB   LOCATION:  SDC                           [facility]   PHYSICIAN:  Dois Davenport A. Rivard, M.D. DATE OF BIRTH:  1981-03-26   DATE OF PROCEDURE:  07/27/2005  DATE OF DISCHARGE:                                 OPERATIVE REPORT   PREOPERATIVE DIAGNOSIS:  Desire for sterilization procedure.   POSTOPERATIVE DIAGNOSIS:  Desire for sterilization procedure.   ANESTHESIA:  General.   PROCEDURE:  Bilateral tubal ligation via laparoscopy.   SURGEON:  Dr. Estanislado Pandy.   ESTIMATED BLOOD LOSS:  Minimal.   PROCEDURE:  After being informed of the planned procedure with possible  complications including bleeding, infection, injury to bowel, bladder or  ureters, need for laparotomy, irreversibility of the procedure as well as  failure rate of 1 in 500 informed consent is obtained. The patient is taken  to OR #7 given general anesthesia with endotracheal intubation. She was  placed in the lithotomy position, prepped and draped in a sterile fashion  and her bladder was emptied with an in-and-out Foley catheter. GYN exam  revealed an anteverted uterus, normal in size and shape and adnexa are  palpated with difficulty due to body mass index. A weighted speculum was  inserted. The anterior lip of the cervix was grasped with a tenaculum and an  Personal assistant was placed. The speculum is removed. The umbilical area is  infiltrated with Marcaine 0.25 10 mL and we perform a semi elliptical  incision for insertion of Veress needle. Placement of Veress needle was  confirmed with saline drop and we then proceed with insufflating a  pneumoperitoneum with CO2 at a maximum pressure of 15 mmHg. Veress needle  was removed and a 10 mm trocar is easily inserted. An operative scope was  then inserted mounted on video monitor.   OBSERVATION:  Anterior cul-de-sac is normal. Posterior cul-de-sac is  normal.  Uterus is normal. Both tubes and both ovaries are normal. The appendix was  not visualized. The liver edges were seen and normal.   Using bipolar cauterization we have grasped each tube and the isthmic  ampullary area including part of the mesosalpinx and we cauterize each tube  on the distance of 1.2 cm. After the procedure instruments were removed. A  pneumoperitoneum was evacuated. Attempt to close the fascia was deemed  impossible due to the thick pannus of the patient. Skin is closed with date  with two subcuticular suture of 3-0 Monocryl.   Instrument and sponge counts complete x2. Estimated blood loss is minimal.  Procedures well tolerated by the patient who is taken to recovery room in a  well and stable condition.      Crist Fat Rivard, M.D.  Electronically Signed     SAR/MEDQ  D:  07/27/2005  T:  07/27/2005  Job:  [id number]

## 2010-12-03 NOTE — Discharge Summary (Signed)
NAME:  Daisy [name], Daisy [name]                 ACCOUNT NO.:  [id number]   MEDICAL RECORD NO.:  [id number]          PATIENT TYPE:  INP   LOCATION:  9118                          [facility]   PHYSICIAN:  Dois Davenport A. Rivard, M.D. DATE OF BIRTH:  12-05-1980   DATE OF ADMISSION:  05/19/2005  DATE OF DISCHARGE:  05/21/2005                                 DISCHARGE SUMMARY   ADMITTING DIAGNOSIS:  Intrauterine pregnancy at 40-3/7th weeks, active  labor, previous cesarean section, desires vaginal birth after cesarean  section.   PROCEDURE:  Vaginal birth after cesarean delivery, repair of second-degree  laceration and left vulvar laceration.   DISCHARGE DIAGNOSIS:  Intrauterine pregnancy at 40-3/7th weeks, active  labor, previous cesarean section, desires vaginal birth after cesarean  section.   [name] is a 30 year old, gravida 5, para 2-0-2-2, who presented at 4-  3/7th weeks in active labor. She presented with prior C-section and a desire  for a trial of VBAC.  Her labor was unremarkable. She progressed normally to  complete and pushed effectively over a 10-minute period for a vaginal birth  of a 8 pounds 9 ounces female infant named Faith Sa-Nia with Apgar scores  with 9 at one minute and 9 at five minutes. The patient has done well in the  postpartum period. Her hemoglobin on the first postpartum day was 9.8. Her  vital signs have remained stable. She is afebrile. She did consent for  postpartum tubal ligation; however, she decided to wait for six weeks  postpartum to have her tubal done. On this her second postpartum day, she is  judged to be in satisfactory condition for discharge.   DISCHARGE INSTRUCTIONS:  Per [facility] handout.   DISCHARGE MEDICATIONS:  1.  Motrin 600 milligrams p.o. q.6h. p.r.n. pain.  2.  Tylox one to two p.o. q.3-4h. pain.  3.  Prenatal vitamins.   The patient will schedule follow-up appointment at [facility] in four weeks to  discuss six-week tubal  ligation.      Rica Koyanagi, C.N.M.      Crist Fat Rivard, M.D.  Electronically Signed    SDM/MEDQ  D:  05/21/2005  T:  05/21/2005  Job:  [id number]

## 2011-04-15 LAB — CBC
HCT: 34 — ABNORMAL LOW
MCHC: 32.7
Platelets: 428 — ABNORMAL HIGH
RBC: 4.28
WBC: 8.9

## 2011-04-15 LAB — COMPREHENSIVE METABOLIC PANEL
AST: 13
Albumin: 3.2 — ABNORMAL LOW
Alkaline Phosphatase: 62
CO2: 25
Calcium: 8.7
GFR calc Af Amer: >60
Total Bilirubin: 0.3
Total Protein: 7.9

## 2011-04-15 LAB — DIFFERENTIAL
Basophils Relative: 0
Lymphocytes Relative: 27
Monocytes Relative: 10
Neutrophils Relative %: 61

## 2011-04-15 LAB — SEDIMENTATION RATE: Sed Rate: 60 — ABNORMAL HIGH

## 2011-04-15 LAB — TSH: TSH: 0.725

## 2012-10-30 ENCOUNTER — Ambulatory Visit: Payer: Self-pay | Admitting: Family Medicine

## 2012-10-30 VITALS — BP 128/90 | HR 82 | Temp 99.1°F | Resp 16 | Ht 63.0 in | Wt 257.0 lb

## 2012-10-30 DIAGNOSIS — Z202 Contact with and (suspected) exposure to infections with a predominantly sexual mode of transmission: Secondary | ICD-10-CM

## 2012-10-30 DIAGNOSIS — Z2089 Contact with and (suspected) exposure to other communicable diseases: Secondary | ICD-10-CM

## 2012-10-30 LAB — RPR

## 2012-10-30 LAB — HIV ANTIBODY (ROUTINE TESTING W REFLEX): HIV: NONREACTIVE

## 2012-10-30 NOTE — Progress Notes (Signed)
Subjective: 32 year old lady with history of report arthritis. She works as a Administrator, sports. She has had one regular boyfriend for the last 2 years. She has had her tubes tied, and does not use additional protection for sexual  intercourse. She has not been having any symptoms. No vaginal lesions. Last menses was March 29. Her boyfriend was diagnosed with having HSV 2 this weekend, so she came in to get checked.  Objective: Lady in no major distress. Looks healthy other than being overweight. Her extra genitalia. Hymen normal. No labial lesions could be noted.  Assessment: HSV exposure(and presumably risk of others STD exposures)  Plan: URiprobe, HSV, HIV, RPR   No treatment needed at this time. We'll wait and see what the tests shows. Strongly advised regular use of barrier protection even though her tubes tied.

## 2012-10-30 NOTE — Patient Instructions (Addendum)
Recommend use of condoms faithfully.

## 2012-10-31 LAB — GC/CHLAMYDIA PROBE AMP: GC Probe RNA: NEGATIVE

## 2012-10-31 LAB — HSV(HERPES SIMPLEX VRS) I + II AB-IGG: HSV 2 Glycoprotein G Ab, IgG: 7.73 IV — ABNORMAL HIGH

## 2012-11-01 ENCOUNTER — Telehealth: Payer: Self-pay

## 2012-11-01 ENCOUNTER — Encounter: Payer: Self-pay | Admitting: Family Medicine

## 2012-11-01 NOTE — Telephone Encounter (Signed)
Pt says she believes that Dr. Alwyn Ren tried to call her. If someone could give her a call back at [phone] If it is after 12 call her cell at [phone]

## 2012-11-01 NOTE — Telephone Encounter (Signed)
I sent her a Mychart message, but did not call her, I have called her back, and advised about her lab results.

## 2012-11-02 ENCOUNTER — Encounter: Payer: Self-pay | Admitting: Family Medicine

## 2012-11-15 ENCOUNTER — Encounter: Payer: Self-pay | Admitting: Family Medicine

## 2012-11-15 ENCOUNTER — Other Ambulatory Visit: Payer: Self-pay | Admitting: Family Medicine

## 2012-11-15 MED ORDER — VALACYCLOVIR HCL 500 MG PO TABS: 500.0000 mg | ORAL_TABLET | Freq: Two times a day (BID) | ORAL | Status: DC

## 2012-12-25 ENCOUNTER — Other Ambulatory Visit: Payer: Self-pay | Admitting: Family Medicine

## 2012-12-26 MED ORDER — VALACYCLOVIR HCL 500 MG PO TABS: 500.0000 mg | ORAL_TABLET | Freq: Two times a day (BID) | ORAL | Status: DC

## 2012-12-26 NOTE — Telephone Encounter (Signed)
LMOM that Rx was sent in.  

## 2013-05-23 ENCOUNTER — Other Ambulatory Visit: Payer: Self-pay

## 2013-06-08 ENCOUNTER — Encounter (HOSPITAL_COMMUNITY): Payer: Self-pay | Admitting: Emergency Medicine

## 2013-06-08 ENCOUNTER — Emergency Department (HOSPITAL_COMMUNITY)
Admission: EM | Admit: 2013-06-08 | Discharge: 2013-06-08 | Disposition: A | Discharge status: Home / Self Care | Payer: PRIVATE HEALTH INSURANCE | Attending: Emergency Medicine | Admitting: Emergency Medicine

## 2013-06-08 DIAGNOSIS — M069 Rheumatoid arthritis, unspecified: Secondary | ICD-10-CM | POA: Insufficient documentation

## 2013-06-08 DIAGNOSIS — Z79899 Other long term (current) drug therapy: Secondary | ICD-10-CM | POA: Insufficient documentation

## 2013-06-08 DIAGNOSIS — R112 Nausea with vomiting, unspecified: Secondary | ICD-10-CM | POA: Insufficient documentation

## 2013-06-08 DIAGNOSIS — Z87891 Personal history of nicotine dependence: Secondary | ICD-10-CM | POA: Insufficient documentation

## 2013-06-08 DIAGNOSIS — M25572 Pain in left ankle and joints of left foot: Secondary | ICD-10-CM

## 2013-06-08 MED ORDER — ONDANSETRON 4 MG PO TBDP
4.0000 mg | ORAL_TABLET | Freq: Once | ORAL | Status: AC
  Administered 2013-06-08: 4 mg via ORAL
  Filled 2013-06-08: qty 1

## 2013-06-08 MED ORDER — KETOROLAC TROMETHAMINE 60 MG/2ML IM SOLN
60.0000 mg | Freq: Once | INTRAMUSCULAR | Status: AC
  Administered 2013-06-08: 60 mg via INTRAMUSCULAR
  Filled 2013-06-08: qty 2

## 2013-06-08 MED ORDER — IBUPROFEN 600 MG PO TABS: 600.0000 mg | ORAL_TABLET | Freq: Four times a day (QID) | ORAL | Status: DC | PRN

## 2013-06-08 MED ORDER — ONDANSETRON HCL 4 MG PO TABS: 4.0000 mg | ORAL_TABLET | Freq: Four times a day (QID) | ORAL | Status: DC

## 2013-06-08 NOTE — ED Provider Notes (Signed)
Medical screening examination/treatment/procedure(s) were performed by non-physician practitioner and as supervising physician I was immediately available for consultation/collaboration.  EKG Interpretation   None         Richardean Canal, MD 06/08/13 [phone]

## 2013-06-08 NOTE — ED Provider Notes (Signed)
CSN: [id number]     Arrival date & time 06/08/13  1320 History   First MD Initiated Contact with Patient 06/08/13 1456     Chief Complaint  Patient presents with  . Foot Pain   (Consider location/radiation/quality/duration/timing/severity/associated sxs/prior Treatment) HPI Pt is a 32yo female with hx of rheumatoid arthritis for which she takes methotrexate. Reports she use to take Enbrel, however had to stop taking in July due to financial reasons.  Since then, reports increased frequency of RA flares.  Reports current left foot and ankle pain started yesterday and gradually worsening. Pain is intermittent, waxing and waning throbbing sensation.  Worse with weight bearing.  She has been seen by her rheumatologist who has given her two trials of prednisone w/o relief as well as recently started her on tramadol but states she started taking yesterday and since then, has had hot flashes as well as nausea and vomiting. Reports at least 10-15 episodes of NBNB emesis today.  Has tried percocet and vicodin in past with similar symptoms. Reports she would like relief from nausea and vomiting.  Will try toradol as well for pain. Denies fever, abdominal pain or diarrhea.     Past Medical History  Diagnosis Date  . Arthritis    Past Surgical History  Procedure Laterality Date  . Cesarean section     Family History  Problem Relation Age of Onset  . Cancer Mother   . Cancer Father   . Asthma Daughter   . Asthma Son    History  Substance Use Topics  . Smoking status: Former Games developer  . Smokeless tobacco: Not on file  . Alcohol Use: No   OB History   Grav Para Term Preterm Abortions TAB SAB Ect Mult Living                 Review of Systems  Constitutional: Positive for chills. Negative for fever, diaphoresis and fatigue.  Gastrointestinal: Positive for nausea and vomiting. Negative for abdominal pain and diarrhea.  Musculoskeletal: Positive for arthralgias and myalgias.       Left foot   All other systems reviewed and are negative.    Allergies  Codeine  Home Medications   Current Outpatient Rx  Name  Route  Sig  Dispense  Refill  . acetaminophen (TYLENOL) 500 MG tablet   Oral   Take 500 mg by mouth every 6 (six) hours as needed.         . etanercept (ENBREL) 50 MG/ML injection   Subcutaneous   Inject 50 mg into the skin once a week.         Marland Kitchen ibuprofen (ADVIL,MOTRIN) 200 MG tablet   Oral   Take 400 mg by mouth every 6 (six) hours as needed.         . methotrexate (RHEUMATREX) 2.5 MG tablet   Oral   Take 2.5 mg by mouth once a week. Caution:Chemotherapy. Protect from light.         . traMADol (ULTRAM) 50 MG tablet   Oral   Take 50 mg by mouth every 6 (six) hours as needed.         . valACYclovir (VALTREX) 500 MG tablet   Oral   Take 1 tablet (500 mg total) by mouth 2 (two) times daily.   6 tablet   9     Please call patient that rx is in pharmacy.   Marland Kitchen ibuprofen (ADVIL,MOTRIN) 600 MG tablet   Oral   Take 1 tablet (600  mg total) by mouth every 6 (six) hours as needed.   30 tablet   0   . ondansetron (ZOFRAN) 4 MG tablet   Oral   Take 1 tablet (4 mg total) by mouth every 6 (six) hours.   12 tablet   0    BP 135/101  Pulse 77  Temp(Src) 97.9 F (36.6 C) (Oral)  Resp 20  SpO2 100%  LMP 06/08/2013 Physical Exam  Nursing note and vitals reviewed. Constitutional: She appears well-developed and well-nourished. No distress.  HENT:  Head: Normocephalic and atraumatic.  Eyes: Conjunctivae are normal. No scleral icterus.  Neck: Normal range of motion.  Cardiovascular: Normal rate, regular rhythm and normal heart sounds.   Pulmonary/Chest: Effort normal and breath sounds normal. No respiratory distress. She has no wheezes. She has no rales. She exhibits no tenderness.  Abdominal: Soft. Bowel sounds are normal. She exhibits no distension and no mass. There is no tenderness. There is no rebound and no guarding.  Musculoskeletal: Normal  range of motion. She exhibits edema and tenderness.  Mild edema of left foot and ankle with mild to moderate tenderness in left ankle. FROM. 5/5 strength. Pedal pulse 2+  Neurological: She is alert.  Skin: Skin is warm and dry. She is not diaphoretic.  No ecchymosis, erythema, or warmth. No rash or lesions.     ED Course  Procedures (including critical care time) Labs Review Labs Reviewed - No data to display Imaging Review No results found.  EKG Interpretation   None       MDM   1. RA (rheumatoid arthritis)   2. Left ankle pain   3. Nausea and vomiting    Pt experiencing nausea and vomiting after taking tramadol for RA flare of pain in left foot.  Advised pt we can give pain medication with zofran in ED.  Pt hesitant to try anything stronger than toradol at this time.  Tx: toradol and zofran.  Fluids challenge.    Pt able to keep down several ounces of PO fluids.  Will give crutches to go home with as well as Rx: zofran and ibuprofen 600mg . Advised to discontinue use of tramadol and to f/u with her rheumatologist for other pain treatment options. Pt verbalized understanding and agreement with tx plan.   Junius Finner, PA-C 06/08/13 1611  Junius Finner, PA-C 06/08/13 [phone]

## 2013-06-08 NOTE — ED Notes (Signed)
Pt reports vomiting. Changed to acuity 3

## 2013-06-08 NOTE — ED Notes (Signed)
Pt. Stated, I have arthritis in my foot and was given Tramadol and its giving me a reaction of making me sick.  My foot is in pain

## 2013-07-19 ENCOUNTER — Encounter: Payer: Self-pay | Admitting: Podiatrist

## 2013-07-19 ENCOUNTER — Ambulatory Visit (INDEPENDENT_AMBULATORY_CARE_PROVIDER_SITE_OTHER): Payer: BC Managed Care – PPO

## 2013-07-19 ENCOUNTER — Ambulatory Visit (INDEPENDENT_AMBULATORY_CARE_PROVIDER_SITE_OTHER): Payer: BC Managed Care – PPO | Admitting: Podiatrist

## 2013-07-19 VITALS — BP 132/67 | HR 82 | Resp 12 | Ht 63.0 in | Wt 263.0 lb

## 2013-07-19 DIAGNOSIS — M7752 Other enthesopathy of left foot: Secondary | ICD-10-CM

## 2013-07-19 DIAGNOSIS — R52 Pain, unspecified: Secondary | ICD-10-CM

## 2013-07-19 DIAGNOSIS — M7750 Other enthesopathy of unspecified foot: Secondary | ICD-10-CM

## 2013-07-19 DIAGNOSIS — M19079 Primary osteoarthritis, unspecified ankle and foot: Secondary | ICD-10-CM

## 2013-07-19 DIAGNOSIS — L03039 Cellulitis of unspecified toe: Secondary | ICD-10-CM

## 2013-07-19 MED ORDER — TRIAMCINOLONE ACETONIDE 10 MG/ML IJ SUSP
10.0000 mg | Freq: Once | INTRAMUSCULAR | Status: AC
  Administered 2013-07-19: 10 mg

## 2013-07-19 NOTE — Patient Instructions (Addendum)
ANTIBACTERIAL SOAP INSTRUCTIONS  THE DAY AFTER PROCEDURE     Shower as usual. Before getting out, place a drop of antibacterial liquid soap (Dial) on a wet, clean washcloth.  Gently wipe washcloth over affected area.  Afterward, rinse the area with warm water.  Blot the area dry with a soft cloth and cover with antibiotic ointment (neosporin, polysporin, bacitracin) and band aid or gauze and tape  OR    Place 3-4 drops of antibacterial liquid soap in a quart of warm tap water.  Submerge foot into water for 20 minutes.  If bandage was applied after your procedure, leave on to allow for easy lift off, then remove and continue with soak for the remaining time.  Next, blot area dry with a soft cloth and cover with a bandage.  Apply other medications as directed by your doctor, such as cortisporin otic solution (eardrops) or neosporin antibiotic ointment  [facility] Care Instructions-Post Nail Surgery  You have had your ingrown toenail and root treated with a chemical.  This chemical causes a burn that will drain and ooze like a blister.  This can drain for 6-8 weeks or longer.  It is important to keep this area clean, covered, and follow the soaking instructions dispensed at the time of your surgery.  This area will eventually dry and form a scab.  Once the scab forms you no longer need to soak or apply a dressing.  If at any time you experience an increase in pain, redness, swelling, or drainage, you should contact the office as soon as possible.

## 2013-07-19 NOTE — Progress Notes (Signed)
   Subjective:    Patient ID: Daisy [name], female    DOB: December 17, 1980, 33 y.o.   MRN: [id number]  HPI Comments: '' LT FOOT IS PAINFUL FOR 1 YEAR. TREATMENT  TRY  INJECTION AND TRIED MELOXICAM BUT NOTHING HELP. ALSO CHECK RT FOOT 1ST AND TOENAIL HAVE DISCOLORATION FOR 1 YEAR BUT TRIED NO TREATMENT.  Foot Pain Associated symptoms include joint swelling.   Patient presents today with her daughter for a painful left foot along the mid anterior ankle region. She is a diagnosed rheumatoid arthritic female and has been undergoing methotrexate therapy as well as enbrel injection therapy until recently when her insurance ran out and she has no more enbrel left. She states the left foot fell great while she was on the medication however since she's been off of it the left foot and painful and uncomfortable. Also relates pain on her left great toenail lateral nail border and states she feels it may be ingrown.   Review of Systems  Musculoskeletal: Positive for gait problem and joint swelling.  Skin: Positive for color change.  All other systems reviewed and are negative.       Objective:   Physical Exam Neurovascular status is intact with palpable symmetric pedal pulses DP and PT bilateral and capillary refill time within normal limits bilateral. Neurological sensation is also intact both epicriticaly and protectively. Musculoskeletal examination reveals pain along the anterior aspect of the ankle and the talonavicular joint left. Decrease in range of motion at the subtalar joint is also present. Pes planovalgus deformity noted as well. Dermatologic exam reveals an ingrown deformity of the left hallux nail lateral nail border the remainder of the toenails are thickened discolored, discolored dystrophic, with onychomycosis present. Second digit right is most involved.   X-rays reveal an arthritis of the talonavicular joint. Possible subtalar coalition is also suspected.    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 Discussed conservative treatments including steroid injection therapy versus surgical correction consisting of  fusion of the talonavicular joint with or without subtalar arthrodesis.  At today's visit a steroid injection was carried out at her anterior ankle and talonavicular joint under sterile technique and the patient tolerated this well. I also did a nonpermanent nail procedure on the left hallux nail in order to remove the ingrown portion of the nail and allow a channel for drainage. We discussed the positive Yetter-term benefits of orthotics to support her foot and will check with her insurance coverage to see if these are covered service for her. If the ankle continues to be symptomatic would consider a CT scan and ankle gauntlet brace at the next visit.

## 2013-07-26 ENCOUNTER — Ambulatory Visit (INDEPENDENT_AMBULATORY_CARE_PROVIDER_SITE_OTHER): Payer: BC Managed Care – PPO | Admitting: *Deleted

## 2013-07-26 DIAGNOSIS — M7750 Other enthesopathy of unspecified foot: Secondary | ICD-10-CM

## 2013-07-26 DIAGNOSIS — M7752 Other enthesopathy of left foot: Secondary | ICD-10-CM

## 2013-07-26 NOTE — Patient Instructions (Signed)
Our office will notify you once your orthotics arrive. At that time an appointment will be needed to pick them up.

## 2013-07-26 NOTE — Progress Notes (Signed)
Scanned patient for orthotics today. Will notify patient once orthotics arrive.

## 2013-08-09 ENCOUNTER — Telehealth: Payer: Self-pay | Admitting: *Deleted

## 2013-08-09 NOTE — Telephone Encounter (Signed)
Pt states received a cortisone injection 07/19/2013, and left foot is hurting all over again.  I told pt it would be best to be seen by Dr Valentina Lucks, and transferred to scheduler.

## 2013-08-15 NOTE — Progress Notes (Signed)
Left 1st toenail fragment sent to [facility] for definitive diagnosis of fungal element.

## 2013-08-20 ENCOUNTER — Telehealth: Payer: Self-pay | Admitting: *Deleted

## 2013-08-20 NOTE — Telephone Encounter (Addendum)
Fungal results - positive fungus.  Dr Valentina Lucks states can treatment with topical, oral or laser or may want to see how the toenail grows out.  Left message to call for results.  Pt returned the call and stays she would like to use the topical.  I told her I would inform Dr Valentina Lucks and call again.

## 2013-08-22 MED ORDER — EFINACONAZOLE 10 % EX SOLN: 1.0000 [drp] | Freq: Every day | CUTANEOUS | Status: DC

## 2013-08-22 NOTE — Telephone Encounter (Addendum)
Message copied by Andres Ege on Thu Aug 22, 2013 10:49 AM ------      Message from: Bronson Ing      Created: Wed Aug 21, 2013  1:01 PM      Regarding: toenail       Call in jublia at [place] for her.            Thanks! ------I informed pt of Dr Shaune Pollack orders for [facility], and it would be ordered from [facility], who would also call her.  Ordered Jublia.

## 2013-08-28 ENCOUNTER — Other Ambulatory Visit: Payer: Self-pay | Admitting: *Deleted

## 2013-08-28 ENCOUNTER — Encounter: Payer: Self-pay | Admitting: Podiatrist

## 2013-08-28 ENCOUNTER — Ambulatory Visit (INDEPENDENT_AMBULATORY_CARE_PROVIDER_SITE_OTHER): Payer: BC Managed Care – PPO | Admitting: Podiatrist

## 2013-08-28 VITALS — BP 114/68 | HR 68 | Resp 12

## 2013-08-28 DIAGNOSIS — M79609 Pain in unspecified limb: Secondary | ICD-10-CM

## 2013-08-28 DIAGNOSIS — M19079 Primary osteoarthritis, unspecified ankle and foot: Secondary | ICD-10-CM

## 2013-08-28 DIAGNOSIS — M7750 Other enthesopathy of unspecified foot: Secondary | ICD-10-CM

## 2013-08-28 DIAGNOSIS — M24876 Other specific joint derangements of unspecified foot, not elsewhere classified: Secondary | ICD-10-CM

## 2013-08-28 DIAGNOSIS — M25372 Other instability, left ankle: Secondary | ICD-10-CM

## 2013-08-28 DIAGNOSIS — M24873 Other specific joint derangements of unspecified ankle, not elsewhere classified: Secondary | ICD-10-CM

## 2013-08-29 NOTE — Progress Notes (Signed)
Subjective: Captola presents today for followup of ankle pain of her left ankle. She states the injection at the last visit was minimally helpful however she continues to have pain along the anterior ankle and lateral ankle left. She has some instability as well. She states that she has severe discomfort when standing for [place] periods of time and she has tried extensive conservative treatments which have included antiinflammatories, compression, elevation, injections, shoe changes and nothing has significantly relieved her of her symptoms.  She would like to know what the next step in getting this ankle better would be.  Objective:  Neurovascular status intact and unchanged to the left foot.  She has pain and laxity to the ankle with a positive anterior drawer sign.  Subtalar joint appears to be normal.  Pain along the anterior and lateral ankle ligamentous structures is palpated.  Pain along the sinus tarsi is present.  xrays were concerning for osteoarthritis and a possible coalition.  Assessment:  Ankle ligamentous injury, versus tendon tear, versus coalition  Plan:  Recommended an MRI of the left ankle to identify the nature of her condition and to be able to plan for possible surgical versus conservative therapy.  We will set up an MRI for her and will contact her with the results.  She was dispensed an ankle stabilizing brace at today's visit as well.  She will continue with antiinflammatory medication until we get the result of the mri.

## 2013-09-04 ENCOUNTER — Telehealth: Payer: Self-pay | Admitting: *Deleted

## 2013-09-04 NOTE — Telephone Encounter (Signed)
[facility] called stating that pt's [employer] will not authorize pt's MRI. Should this be canceled?

## 2013-09-05 NOTE — Telephone Encounter (Signed)
Forward to Lisa 

## 2013-09-06 ENCOUNTER — Encounter: Payer: Self-pay | Admitting: Podiatrist

## 2013-09-06 ENCOUNTER — Ambulatory Visit (INDEPENDENT_AMBULATORY_CARE_PROVIDER_SITE_OTHER): Payer: BC Managed Care – PPO | Admitting: Podiatrist

## 2013-09-06 VITALS — BP 132/67 | HR 80 | Resp 16

## 2013-09-06 DIAGNOSIS — M7750 Other enthesopathy of unspecified foot: Secondary | ICD-10-CM

## 2013-09-06 NOTE — Progress Notes (Signed)
   Subjective: Daisy [name] presents today to pick up her orthotics. They're too [place] fit in the shoe she hasn't with her at today's visit that she thinks they may fit into her tennis shoes at home. She will drop them by the office if they don't fit in her tennis shoes and I will adjust them and make them fit for her. We are also waiting for [facility] to improve an MRI. I will need to do appear to peer prior authorization which we are working on at this time. Patient demonstrates an understanding of the conversation and I will adjust her orthotics and we'll wait for the MRI to decide further treatment and therapy.

## 2013-09-06 NOTE — Patient Instructions (Signed)
WEARING INSTRUCTIONS FOR ORTHOTICS  Don't expect to be comfortable wearing your orthotic devices for the first time.  Like eyeglasses, you may be aware of them as time passes, they will not be uncomfortable and you will enjoy wearing them.  FOLLOW THESE INSTRUCTIONS EXACTLY!  1. Wear your orthotic devices for:       Not more than 1 hour the first day.       Not more than 2 hours the second day.       Not more than 3 hours the third day and so on.        Or wear them for as [place] as they feel comfortable.       If you experience discomfort in your feet or legs take them out.  When feet & legs feel       better, put them back in.  You do need to be consistent and wear them a little        everyday. 2.   If at any time the orthotic devices become acutely uncomfortable before the       time for that particular day, STOP WEARING THEM. 3.   On the next day, do not increase the wearing time. 4.   Subsequently, increase the wearing time by 15-30 minutes only if comfortable to do       so. 5.   You will be seen by your doctor about 2-4 weeks after you receive your orthotic       devices, at which time you will probably be wearing your devices comfortably        for about 8 hours or more a day. 6.   Some patients occasionally report mild aches or discomfort in other parts of the of       body such as the knees, hips or back after 3 or 4 consecutive hours of wear.  If this       is the case with you, do not extend your wearing time.  Instead, cut it back an hour or       two.  In all likelihood, these symptoms will disappear in a short period of time as your       body posture realigns itself and functions more efficiently. 7.   It is possible that your orthotic device may require some small changes or adjustment       to improve their function or make them more comfortable.   This is usually not done       before one to three months have elapsed.  These adjustments are made in        accordance  with the changed position your feet are assuming as a result of       improved biomechanical function. 8.   In women's shoes, it's not unusual for your heel to slip out of the shoe, particularly if       they are step-in-shoes.  If this is the case, try other shoes or other styles.  Try to       purchase shoes which have deeper heal seats or higher heel counters. 9.   Squeaking of orthotics devices in the shoes is due to the movement of the devices       when they are functioning normally.  To eliminate squeaking, simply dust some       baby powder into your shoes before inserting the devices.  If this does not work,          apply soap or wax to the edges of the orthotic devices or put a tissue into the shoes. 10. It is important that you follow these directions explicitly.  Failure to do so will simply       prolong the adjustment period or create problems which are easily avoided.  It makes       no difference if you are wearing your orthotic devices for only a few hours after        several months, so [place] as you are wearing them comfortably for those hours. 11. If you have any questions or complaints, contact our office.  We have no way of       knowing about your problems unless you tell us.  If we do not hear from you, we will       assume that you are proceeding well.  

## 2013-09-09 ENCOUNTER — Other Ambulatory Visit: Payer: PRIVATE HEALTH INSURANCE

## 2013-09-10 ENCOUNTER — Telehealth: Payer: Self-pay | Admitting: *Deleted

## 2013-09-10 NOTE — Telephone Encounter (Addendum)
Faxed approval code [id number] from [facility] after [place] - PCR review valid for 30 days.  Informed pt MRI was approved.

## 2013-09-13 ENCOUNTER — Ambulatory Visit: Payer: BC Managed Care – PPO | Admitting: Podiatrist

## 2013-09-19 ENCOUNTER — Ambulatory Visit
Admission: RE | Admit: 2013-09-19 | Discharge: 2013-09-19 | Disposition: A | Discharge status: Home / Self Care | Payer: BC Managed Care – PPO | Source: Ambulatory Visit | Attending: Podiatrist | Admitting: Podiatrist

## 2013-09-19 DIAGNOSIS — M79609 Pain in unspecified limb: Secondary | ICD-10-CM

## 2013-09-23 ENCOUNTER — Telehealth: Payer: Self-pay | Admitting: *Deleted

## 2013-09-23 NOTE — Telephone Encounter (Deleted)
Message copied by Andres Ege on Mon Sep 23, 2013  8:39 AM ------      Message from: Bronson Ing      Created: Fri Sep 20, 2013  5:28 PM      Regarding: mri result       Please ask Brailey to be seen to discuss MRI report and  Options we have for treatment.  If she asks you can tell her some of her bones look changed due to her arthritis.  Thanks      ----- Message -----         From: Rad Results In Interface         Sent: 09/20/2013   8:43 AM           To: Trudie Buckler, DPM                   ------

## 2013-09-23 NOTE — Telephone Encounter (Addendum)
Message copied by Andres Ege on Mon Sep 23, 2013  9:58 AM Left a message for pt to set up an appt to discuss MRI results and treatment.      Message from: Bronson Ing      Created: Fri Sep 20, 2013  5:28 PM      Regarding: mri result       Please ask Arlyne to be seen to discuss MRI report and  Options we have for treatment.  If she asks you can tell her some of her bones look changed due to her arthritis.  Thanks      ----- Message -----         From: Rad Results In Interface         Sent: 09/20/2013   8:43 AM           To: Trudie Buckler, DPM                   ------

## 2013-09-27 ENCOUNTER — Ambulatory Visit (INDEPENDENT_AMBULATORY_CARE_PROVIDER_SITE_OTHER): Payer: BC Managed Care – PPO | Admitting: Podiatrist

## 2013-09-27 ENCOUNTER — Encounter: Payer: Self-pay | Admitting: Podiatrist

## 2013-09-27 VITALS — BP 126/83 | HR 66 | Resp 18

## 2013-09-27 DIAGNOSIS — M6789 Other specified disorders of synovium and tendon, multiple sites: Secondary | ICD-10-CM

## 2013-09-27 DIAGNOSIS — M19079 Primary osteoarthritis, unspecified ankle and foot: Secondary | ICD-10-CM

## 2013-09-27 DIAGNOSIS — M76829 Posterior tibial tendinitis, unspecified leg: Secondary | ICD-10-CM

## 2013-09-27 NOTE — Progress Notes (Signed)
It feels worse than it did and still hurts to bend it on my left foot and by the end of the day it feels kinda better but when I get home and sit, it starts all over again  Subjective:  Daisy [name] presents today for her MRI result of her left ankle.  She states she continues having ankle pain from the medial side of the ankle anteriorly extending to the lateral aspect of the ankle.  The conservative measures have helped minimally including injection therapy, ankle brace, antiinflammatories.  She is a rheumatoid arthritic patient and is on her methotrexate and enbrel.    Objective:  Neurovascular status intact to the left ankle.  Continued pain as stated above is present left ankle.    MRI IMPRESSION:  1. MR findings most consistent with active/aggressive rheumatoid  arthritis involving the talonavicular and calcaneocuboid joints with  severe reactive changes and secondary osteoarthritis.  2. Significant posterior tibialis tendinopathy and tenosynovitis.  Assessment:  Posterior tibialis tendon tear,  Rheumatoid arthritis changes of ankle  Plan:  Discussed the extent of her MRI findings and recommended a referral to Dr. Doran Durand for further evaluation.  A referral will be made on her behalf.  She will continue to wear her ankle brace.

## 2013-09-27 NOTE — Patient Instructions (Signed)
We will refer you to Dr. Doran Durand at [facility] for follow up on your ankle.

## 2013-09-30 ENCOUNTER — Other Ambulatory Visit: Payer: Self-pay | Admitting: *Deleted

## 2013-09-30 DIAGNOSIS — M79606 Pain in leg, unspecified: Secondary | ICD-10-CM

## 2013-10-01 ENCOUNTER — Telehealth: Payer: Self-pay | Admitting: *Deleted

## 2013-10-01 NOTE — Telephone Encounter (Signed)
Patient called inquiring about a referral to Dr. Nona Dell office.  Saw Dr. Valentina Lucks on Friday and hasn't heard anything.  I returned her call, left her a message.  Appointment was scheduled for 11/06/13 at 10:15am, arrival time is at 9:45am.

## 2013-11-14 ENCOUNTER — Other Ambulatory Visit (HOSPITAL_COMMUNITY)
Admission: RE | Admit: 2013-11-14 | Discharge: 2013-11-14 | Disposition: A | Discharge status: Home / Self Care | Payer: BC Managed Care – PPO | Source: Ambulatory Visit | Attending: Family Medicine | Admitting: Family Medicine

## 2013-11-14 ENCOUNTER — Other Ambulatory Visit: Payer: Self-pay | Admitting: Physician Assistant

## 2013-11-14 DIAGNOSIS — Z0000 Encounter for general adult medical examination without abnormal findings: Secondary | ICD-10-CM | POA: Insufficient documentation

## 2013-11-15 ENCOUNTER — Encounter (HOSPITAL_BASED_OUTPATIENT_CLINIC_OR_DEPARTMENT_OTHER): Payer: Self-pay | Admitting: *Deleted

## 2013-11-15 DIAGNOSIS — M1990 Unspecified osteoarthritis, unspecified site: Secondary | ICD-10-CM

## 2013-11-15 DIAGNOSIS — M6702 Short Achilles tendon (acquired), left ankle: Secondary | ICD-10-CM

## 2013-11-15 HISTORY — DX: Unspecified osteoarthritis, unspecified site: M19.90

## 2013-11-15 HISTORY — DX: Short Achilles tendon (acquired), left ankle: M67.02

## 2013-11-20 ENCOUNTER — Other Ambulatory Visit: Payer: Self-pay | Admitting: Orthopedic Surgery

## 2013-11-21 ENCOUNTER — Encounter (HOSPITAL_BASED_OUTPATIENT_CLINIC_OR_DEPARTMENT_OTHER): Payer: Self-pay

## 2013-11-21 ENCOUNTER — Ambulatory Visit (HOSPITAL_BASED_OUTPATIENT_CLINIC_OR_DEPARTMENT_OTHER): Payer: BC Managed Care – PPO | Admitting: Certified Registered"

## 2013-11-21 ENCOUNTER — Ambulatory Visit (HOSPITAL_BASED_OUTPATIENT_CLINIC_OR_DEPARTMENT_OTHER)
Admission: RE | Admit: 2013-11-21 | Discharge: 2013-11-22 | Disposition: A | Discharge status: Home / Self Care | Payer: BC Managed Care – PPO | Source: Ambulatory Visit | Attending: Orthopedic Surgery | Admitting: Orthopedic Surgery

## 2013-11-21 ENCOUNTER — Encounter (HOSPITAL_BASED_OUTPATIENT_CLINIC_OR_DEPARTMENT_OTHER): Payer: BC Managed Care – PPO | Admitting: Certified Registered"

## 2013-11-21 ENCOUNTER — Encounter (HOSPITAL_BASED_OUTPATIENT_CLINIC_OR_DEPARTMENT_OTHER)
Admission: RE | Disposition: A | Discharge status: Home / Self Care | Payer: Self-pay | Source: Ambulatory Visit | Attending: Orthopedic Surgery

## 2013-11-21 DIAGNOSIS — M19079 Primary osteoarthritis, unspecified ankle and foot: Secondary | ICD-10-CM | POA: Insufficient documentation

## 2013-11-21 DIAGNOSIS — M129 Arthropathy, unspecified: Secondary | ICD-10-CM | POA: Insufficient documentation

## 2013-11-21 DIAGNOSIS — Z91018 Allergy to other foods: Secondary | ICD-10-CM | POA: Insufficient documentation

## 2013-11-21 DIAGNOSIS — M19072 Primary osteoarthritis, left ankle and foot: Secondary | ICD-10-CM | POA: Diagnosis present

## 2013-11-21 DIAGNOSIS — Z885 Allergy status to narcotic agent status: Secondary | ICD-10-CM | POA: Insufficient documentation

## 2013-11-21 DIAGNOSIS — M069 Rheumatoid arthritis, unspecified: Secondary | ICD-10-CM | POA: Insufficient documentation

## 2013-11-21 DIAGNOSIS — Z6841 Body Mass Index (BMI) 40.0 and over, adult: Secondary | ICD-10-CM | POA: Insufficient documentation

## 2013-11-21 DIAGNOSIS — M6240 Contracture of muscle, unspecified site: Secondary | ICD-10-CM | POA: Insufficient documentation

## 2013-11-21 DIAGNOSIS — Z87891 Personal history of nicotine dependence: Secondary | ICD-10-CM | POA: Insufficient documentation

## 2013-11-21 DIAGNOSIS — Z79899 Other long term (current) drug therapy: Secondary | ICD-10-CM | POA: Insufficient documentation

## 2013-11-21 HISTORY — DX: Short Achilles tendon (acquired), left ankle: M67.02

## 2013-11-21 HISTORY — PX: GASTROCNEMIUS RECESSION: SHX863

## 2013-11-21 HISTORY — PX: ANKLE FUSION: SHX881

## 2013-11-21 HISTORY — DX: Migraine, unspecified, not intractable, without status migrainosus: G43.909

## 2013-11-21 HISTORY — DX: Rheumatoid arthritis, unspecified: M06.9

## 2013-11-21 SURGERY — RECESSION, MUSCLE, GASTROCNEMIUS
Anesthesia: General | Site: Leg Lower | Laterality: Left

## 2013-11-21 MED ORDER — HYDROMORPHONE HCL PF 1 MG/ML IJ SOLN
0.5000 mg | INTRAMUSCULAR | Status: DC | PRN
  Administered 2013-11-22: 1 mg via INTRAVENOUS
  Filled 2013-11-21: qty 1

## 2013-11-21 MED ORDER — ACETAMINOPHEN 500 MG PO TABS
ORAL_TABLET | ORAL | Status: AC
  Filled 2013-11-21: qty 2

## 2013-11-21 MED ORDER — CEFAZOLIN SODIUM-DEXTROSE 2-3 GM-% IV SOLR
2.0000 g | INTRAVENOUS | Status: AC
Start: 2013-11-21 — End: 2013-11-21
  Administered 2013-11-21: 2 g via INTRAVENOUS

## 2013-11-21 MED ORDER — FENTANYL CITRATE 0.05 MG/ML IJ SOLN
INTRAMUSCULAR | Status: AC
  Filled 2013-11-21: qty 2

## 2013-11-21 MED ORDER — OXYCODONE HCL 5 MG PO TABS: 5.0000 mg | ORAL_TABLET | Freq: Once | ORAL | Status: DC | PRN

## 2013-11-21 MED ORDER — BUPIVACAINE-EPINEPHRINE 0.5% -1:200000 IJ SOLN
INTRAMUSCULAR | Status: DC | PRN
  Administered 2013-11-21: 13 mL

## 2013-11-21 MED ORDER — DOCUSATE SODIUM 100 MG PO CAPS: 100.0000 mg | ORAL_CAPSULE | Freq: Two times a day (BID) | ORAL | Status: DC

## 2013-11-21 MED ORDER — FENTANYL CITRATE 0.05 MG/ML IJ SOLN
INTRAMUSCULAR | Status: DC | PRN
  Administered 2013-11-21 (×4): 25 ug via INTRAVENOUS

## 2013-11-21 MED ORDER — SENNOSIDES 8.6 MG PO TABS: 2.0000 | ORAL_TABLET | Freq: Every day | ORAL | Status: DC

## 2013-11-21 MED ORDER — ACETAMINOPHEN 500 MG PO TABS
1000.0000 mg | ORAL_TABLET | Freq: Once | ORAL | Status: AC
  Administered 2013-11-21: 1000 mg via ORAL

## 2013-11-21 MED ORDER — BACITRACIN ZINC 500 UNIT/GM EX OINT
TOPICAL_OINTMENT | CUTANEOUS | Status: AC
  Filled 2013-11-21: qty 28.35

## 2013-11-21 MED ORDER — MIDAZOLAM HCL 2 MG/2ML IJ SOLN
1.0000 mg | INTRAMUSCULAR | Status: DC | PRN
  Administered 2013-11-21: 2 mg via INTRAVENOUS

## 2013-11-21 MED ORDER — BUPIVACAINE-EPINEPHRINE (PF) 0.5% -1:200000 IJ SOLN
INTRAMUSCULAR | Status: DC | PRN
  Administered 2013-11-21: 20 mL

## 2013-11-21 MED ORDER — LACTATED RINGERS IV SOLN
INTRAVENOUS | Status: DC
  Administered 2013-11-21 (×2): via INTRAVENOUS

## 2013-11-21 MED ORDER — PROPOFOL 10 MG/ML IV BOLUS
INTRAVENOUS | Status: DC | PRN
  Administered 2013-11-21: 240 mg via INTRAVENOUS

## 2013-11-21 MED ORDER — 0.9 % SODIUM CHLORIDE (POUR BTL) OPTIME
TOPICAL | Status: DC | PRN
  Administered 2013-11-21: 300 mL

## 2013-11-21 MED ORDER — HYDROMORPHONE HCL 2 MG PO TABS: 2.0000 mg | ORAL_TABLET | ORAL | Status: DC | PRN

## 2013-11-21 MED ORDER — SODIUM CHLORIDE 0.9 % IV SOLN: INTRAVENOUS | Status: DC

## 2013-11-21 MED ORDER — SODIUM CHLORIDE 0.9 % IV SOLN
INTRAVENOUS | Status: DC
  Administered 2013-11-21: 14:00:00 via INTRAVENOUS

## 2013-11-21 MED ORDER — ROPIVACAINE HCL 5 MG/ML IJ SOLN
INTRAMUSCULAR | Status: DC | PRN
  Administered 2013-11-21: 20 mL via PERINEURAL

## 2013-11-21 MED ORDER — ONDANSETRON HCL 4 MG/2ML IJ SOLN
INTRAMUSCULAR | Status: AC
  Filled 2013-11-21: qty 2

## 2013-11-21 MED ORDER — ONDANSETRON HCL 4 MG PO TABS: 4.0000 mg | ORAL_TABLET | Freq: Four times a day (QID) | ORAL | Status: DC | PRN

## 2013-11-21 MED ORDER — ASPIRIN EC 325 MG PO TBEC: 325.0000 mg | DELAYED_RELEASE_TABLET | Freq: Every day | ORAL | Status: DC

## 2013-11-21 MED ORDER — MIDAZOLAM HCL 2 MG/ML PO SYRP: 12.0000 mg | ORAL_SOLUTION | Freq: Once | ORAL | Status: DC | PRN

## 2013-11-21 MED ORDER — PROMETHAZINE HCL 25 MG/ML IJ SOLN
12.5000 mg | Freq: Four times a day (QID) | INTRAMUSCULAR | Status: DC | PRN
  Administered 2013-11-21: 12.5 mg via INTRAVENOUS
  Filled 2013-11-21: qty 1

## 2013-11-21 MED ORDER — SENNA 8.6 MG PO TABS: 2.0000 | ORAL_TABLET | Freq: Two times a day (BID) | ORAL | Status: DC

## 2013-11-21 MED ORDER — ACETAMINOPHEN 325 MG PO TABS
650.0000 mg | ORAL_TABLET | Freq: Four times a day (QID) | ORAL | Status: DC | PRN
  Administered 2013-11-21: 650 mg via ORAL
  Filled 2013-11-21: qty 2

## 2013-11-21 MED ORDER — DEXAMETHASONE SODIUM PHOSPHATE 10 MG/ML IJ SOLN
INTRAMUSCULAR | Status: DC | PRN
  Administered 2013-11-21: 10 mg via INTRAVENOUS

## 2013-11-21 MED ORDER — BACITRACIN ZINC 500 UNIT/GM EX OINT
TOPICAL_OINTMENT | CUTANEOUS | Status: DC | PRN
  Administered 2013-11-21: 1 via TOPICAL

## 2013-11-21 MED ORDER — OXYCODONE HCL 5 MG/5ML PO SOLN: 5.0000 mg | Freq: Once | ORAL | Status: DC | PRN

## 2013-11-21 MED ORDER — ONDANSETRON HCL 4 MG/2ML IJ SOLN: 4.0000 mg | Freq: Four times a day (QID) | INTRAMUSCULAR | Status: DC | PRN

## 2013-11-21 MED ORDER — HYDROMORPHONE HCL 2 MG PO TABS
2.0000 mg | ORAL_TABLET | ORAL | Status: DC | PRN
  Administered 2013-11-22 (×2): 2 mg via ORAL
  Filled 2013-11-21 (×2): qty 1

## 2013-11-21 MED ORDER — PROMETHAZINE HCL 12.5 MG PO TABS: 12.5000 mg | ORAL_TABLET | Freq: Four times a day (QID) | ORAL | Status: DC | PRN

## 2013-11-21 MED ORDER — ENOXAPARIN SODIUM 40 MG/0.4ML ~~LOC~~ SOLN
40.0000 mg | SUBCUTANEOUS | Status: DC
Start: 2013-11-22 — End: 2013-11-22

## 2013-11-21 MED ORDER — ONDANSETRON HCL 4 MG/2ML IJ SOLN
4.0000 mg | Freq: Once | INTRAMUSCULAR | Status: AC | PRN
  Administered 2013-11-21: 4 mg via INTRAVENOUS

## 2013-11-21 MED ORDER — FENTANYL CITRATE 0.05 MG/ML IJ SOLN
50.0000 ug | INTRAMUSCULAR | Status: DC | PRN
  Administered 2013-11-21: 100 ug via INTRAVENOUS

## 2013-11-21 MED ORDER — LIDOCAINE HCL (CARDIAC) 20 MG/ML IV SOLN
INTRAVENOUS | Status: DC | PRN
  Administered 2013-11-21: 30 mg via INTRAVENOUS

## 2013-11-21 MED ORDER — MIDAZOLAM HCL 2 MG/2ML IJ SOLN
INTRAMUSCULAR | Status: AC
  Filled 2013-11-21: qty 2

## 2013-11-21 MED ORDER — BUPIVACAINE-EPINEPHRINE (PF) 0.5% -1:200000 IJ SOLN
INTRAMUSCULAR | Status: AC
  Filled 2013-11-21: qty 30

## 2013-11-21 MED ORDER — HYDROMORPHONE HCL PF 1 MG/ML IJ SOLN
INTRAMUSCULAR | Status: AC
  Filled 2013-11-21: qty 1

## 2013-11-21 MED ORDER — ONDANSETRON HCL 4 MG/2ML IJ SOLN
INTRAMUSCULAR | Status: DC | PRN
  Administered 2013-11-21: 4 mg via INTRAVENOUS

## 2013-11-21 MED ORDER — HYDROMORPHONE HCL PF 1 MG/ML IJ SOLN
0.2500 mg | INTRAMUSCULAR | Status: DC | PRN
  Administered 2013-11-21: 0.25 mg via INTRAVENOUS
  Administered 2013-11-21 (×2): 0.5 mg via INTRAVENOUS

## 2013-11-21 MED ORDER — CHLORHEXIDINE GLUCONATE 4 % EX LIQD: 60.0000 mL | Freq: Once | CUTANEOUS | Status: DC

## 2013-11-21 MED ORDER — FENTANYL CITRATE 0.05 MG/ML IJ SOLN
INTRAMUSCULAR | Status: AC
  Filled 2013-11-21: qty 6

## 2013-11-21 MED ORDER — MIDAZOLAM HCL 5 MG/5ML IJ SOLN
INTRAMUSCULAR | Status: DC | PRN
  Administered 2013-11-21: 2 mg via INTRAVENOUS

## 2013-11-21 MED ORDER — CEFAZOLIN SODIUM-DEXTROSE 2-3 GM-% IV SOLR
INTRAVENOUS | Status: AC
  Filled 2013-11-21: qty 50

## 2013-11-21 MED ORDER — SODIUM CHLORIDE 0.9 % IJ SOLN
INTRAMUSCULAR | Status: AC
  Filled 2013-11-21: qty 10

## 2013-11-21 SURGICAL SUPPLY — 90 items
BANDAGE ESMARK 6X9 LF (GAUZE/BANDAGES/DRESSINGS) ×2 IMPLANT
BIT DRILL SOLID 2.5X110 (BIT) IMPLANT
BIT DRILL SOLID 3.5X110 (BIT) IMPLANT
BLADE 15 SAFETY STRL DISP (BLADE) IMPLANT
BLADE AVERAGE 25MMX9MM (BLADE)
BLADE AVERAGE 25X9 (BLADE) IMPLANT
BLADE MICRO SAGITTAL (BLADE) IMPLANT
BLADE SURG 15 STRL LF DISP TIS (BLADE) ×4 IMPLANT
BLADE SURG 15 STRL SS (BLADE) ×4
BNDG COHESIVE 4X5 TAN STRL (GAUZE/BANDAGES/DRESSINGS) ×4 IMPLANT
BNDG COHESIVE 6X5 TAN STRL LF (GAUZE/BANDAGES/DRESSINGS) ×4 IMPLANT
BNDG ESMARK 6X9 LF (GAUZE/BANDAGES/DRESSINGS) ×4
BOOT STEPPER DURA LG (SOFTGOODS) IMPLANT
BOOT STEPPER DURA MED (SOFTGOODS) IMPLANT
BOOT STEPPER DURA XLG (SOFTGOODS) IMPLANT
CHLORAPREP W/TINT 26ML (MISCELLANEOUS) ×4 IMPLANT
CLOSURE WOUND 1/2 X4 (GAUZE/BANDAGES/DRESSINGS)
COVER MAYO STAND STRL (DRAPES) IMPLANT
COVER TABLE BACK 60X90 (DRAPES) ×4 IMPLANT
CUFF TOURNIQUET SINGLE 34IN LL (TOURNIQUET CUFF) IMPLANT
CUFF TOURNIQUET SINGLE 44IN (TOURNIQUET CUFF) ×4 IMPLANT
DECANTER SPIKE VIAL GLASS SM (MISCELLANEOUS) IMPLANT
DRAPE C-ARM 42X72 X-RAY (DRAPES) IMPLANT
DRAPE C-ARMOR (DRAPES) IMPLANT
DRAPE EXTREMITY T 121X128X90 (DRAPE) ×4 IMPLANT
DRAPE OEC MINIVIEW 54X84 (DRAPES) ×4 IMPLANT
DRAPE U-SHAPE 47X51 STRL (DRAPES) ×4 IMPLANT
DRAPE UTILITY XL STRL (DRAPES) IMPLANT
DRSG ADAPTIC 3X8 NADH LF (GAUZE/BANDAGES/DRESSINGS) ×4 IMPLANT
DRSG EMULSION OIL 3X3 NADH (GAUZE/BANDAGES/DRESSINGS) IMPLANT
DRSG PAD ABDOMINAL 8X10 ST (GAUZE/BANDAGES/DRESSINGS) ×8 IMPLANT
ELECT REM PT RETURN 9FT ADLT (ELECTROSURGICAL) ×4
ELECTRODE REM PT RTRN 9FT ADLT (ELECTROSURGICAL) ×2 IMPLANT
GAUZE SPONGE 4X4 12PLY STRL (GAUZE/BANDAGES/DRESSINGS) ×8 IMPLANT
GLOVE BIO SURGEON STRL SZ8 (GLOVE) ×4 IMPLANT
GLOVE BIOGEL PI IND STRL 7.0 (GLOVE) ×2 IMPLANT
GLOVE BIOGEL PI IND STRL 7.5 (GLOVE) IMPLANT
GLOVE BIOGEL PI IND STRL 8 (GLOVE) ×2 IMPLANT
GLOVE BIOGEL PI INDICATOR 7.0 (GLOVE) ×2
GLOVE BIOGEL PI INDICATOR 7.5 (GLOVE)
GLOVE BIOGEL PI INDICATOR 8 (GLOVE) ×2
GLOVE ECLIPSE 6.5 STRL STRAW (GLOVE) ×4 IMPLANT
GLOVE ECLIPSE 7.0 STRL STRAW (GLOVE) IMPLANT
GLOVE EXAM NITRILE MD LF STRL (GLOVE) ×4 IMPLANT
GOWN STRL REUS W/ TWL LRG LVL3 (GOWN DISPOSABLE) ×2 IMPLANT
GOWN STRL REUS W/ TWL XL LVL3 (GOWN DISPOSABLE) ×2 IMPLANT
GOWN STRL REUS W/TWL LRG LVL3 (GOWN DISPOSABLE) ×2
GOWN STRL REUS W/TWL XL LVL3 (GOWN DISPOSABLE) ×2
K-WIRE 1.8 (WIRE) ×6
K-WIRE FX200X1.8XTROC TIP (WIRE) ×6
KIT ASP BONE MRW 60CC (KITS) IMPLANT
KWIRE FX200X1.8XTROC TIP (WIRE) ×6 IMPLANT
NEEDLE HYPO 22GX1.5 SAFETY (NEEDLE) ×4 IMPLANT
NEEDLE HYPO 25X1 1.5 SAFETY (NEEDLE) IMPLANT
NS IRRIG 1000ML POUR BTL (IV SOLUTION) ×4 IMPLANT
PACK BASIN DAY SURGERY FS (CUSTOM PROCEDURE TRAY) ×4 IMPLANT
PAD CAST 4YDX4 CTTN HI CHSV (CAST SUPPLIES) ×4 IMPLANT
PADDING CAST ABS 4INX4YD NS (CAST SUPPLIES)
PADDING CAST ABS COTTON 4X4 ST (CAST SUPPLIES) IMPLANT
PADDING CAST COTTON 4X4 STRL (CAST SUPPLIES) ×4
PADDING CAST COTTON 6X4 STRL (CAST SUPPLIES) ×4 IMPLANT
PENCIL BUTTON HOLSTER BLD 10FT (ELECTRODE) ×4 IMPLANT
PUTTY DBM STAGRAFT 2CC (Putty) ×4 IMPLANT
SANITIZER HAND PURELL 535ML FO (MISCELLANEOUS) ×4 IMPLANT
SCREW CANN 5.0X30MM (Screw) ×4 IMPLANT
SCREW CANN 5.0X40MM (Screw) ×4 IMPLANT
SHEET MEDIUM DRAPE 40X70 STRL (DRAPES) ×8 IMPLANT
SLEEVE SCD COMPRESS KNEE MED (MISCELLANEOUS) ×4 IMPLANT
SPLINT FAST PLASTER 5X30 (CAST SUPPLIES) ×40
SPLINT PLASTER CAST FAST 5X30 (CAST SUPPLIES) ×40 IMPLANT
SPONGE LAP 18X18 X RAY DECT (DISPOSABLE) ×4 IMPLANT
STOCKINETTE 6  STRL (DRAPES) ×2
STOCKINETTE 6 STRL (DRAPES) ×2 IMPLANT
STRIP CLOSURE SKIN 1/2X4 (GAUZE/BANDAGES/DRESSINGS) IMPLANT
SUCTION FRAZIER TIP 10 FR DISP (SUCTIONS) ×4 IMPLANT
SUT ETHILON 3 0 PS 1 (SUTURE) ×4 IMPLANT
SUT MNCRL AB 3-0 PS2 18 (SUTURE) ×4 IMPLANT
SUT VIC AB 0 SH 27 (SUTURE) IMPLANT
SUT VIC AB 2-0 SH 18 (SUTURE) IMPLANT
SUT VIC AB 2-0 SH 27 (SUTURE) ×2
SUT VIC AB 2-0 SH 27XBRD (SUTURE) ×2 IMPLANT
SUT VICRYL 4-0 PS2 18IN ABS (SUTURE) IMPLANT
SYR BULB 3OZ (MISCELLANEOUS) ×4 IMPLANT
SYR CONTROL 10ML LL (SYRINGE) ×4 IMPLANT
TOWEL OR 17X24 6PK STRL BLUE (TOWEL DISPOSABLE) ×8 IMPLANT
TOWEL OR NON WOVEN STRL DISP B (DISPOSABLE) IMPLANT
TUBE CONNECTING 20'X1/4 (TUBING) ×1
TUBE CONNECTING 20X1/4 (TUBING) ×3 IMPLANT
UNDERPAD 30X30 INCONTINENT (UNDERPADS AND DIAPERS) ×4 IMPLANT
YANKAUER SUCT BULB TIP NO VENT (SUCTIONS) IMPLANT

## 2013-11-21 NOTE — Anesthesia Preprocedure Evaluation (Signed)
Anesthesia Evaluation  Patient identified by MRN, date of birth, ID band Patient awake    Reviewed: Allergy & Precautions, H&P , NPO status , Patient's Chart, lab work & pertinent test results  Airway Mallampati: I TM Distance: >3 FB Neck ROM: Full    Dental  (+) Teeth Intact, Dental Advisory Given, Upper Dentures   Pulmonary former smoker,  breath sounds clear to auscultation        Cardiovascular Rhythm:Regular Rate:Normal     Neuro/Psych    GI/Hepatic   Endo/Other  Morbid obesity  Renal/GU      Musculoskeletal   Abdominal   Peds  Hematology   Anesthesia Other Findings   Reproductive/Obstetrics                           Anesthesia Physical Anesthesia Plan  ASA: III  Anesthesia Plan: General   Post-op Pain Management:    Induction: Intravenous  Airway Management Planned: LMA  Additional Equipment:   Intra-op Plan:   Post-operative Plan: Extubation in OR  Informed Consent: I have reviewed the patients History and Physical, chart, labs and discussed the procedure including the risks, benefits and alternatives for the proposed anesthesia with the patient or authorized representative who has indicated his/her understanding and acceptance.   Dental advisory given  Plan Discussed with: CRNA and Anesthesiologist  Anesthesia Plan Comments:         Anesthesia Quick Evaluation

## 2013-11-21 NOTE — H&P (Signed)
Daisy [name] is an 33 y.o. female.   Chief Complaint: left foot pain HPI: 33 y/o female with PMH of rheumatoid arthritis presents for left gastroc recession and talonavicular arthrodesis.  She has severe arthritic changes at the left talonavicular joint and a positive response to a diagnostic injection.  Past Medical History  Diagnosis Date  . Migraines   . Arthritis 11/2013    avascular arthritis left talon  . Rheumatoid arthritis   . Tightness of left heel cord 11/2013    Past Surgical History  Procedure Laterality Date  . Cesarean section  11/16/2002  . Tubal ligation  07/27/2005    laparoscopic    Family History  Problem Relation Age of Onset  . Cancer Mother   . Cancer Father   . Asthma Daughter   . Asthma Son    Social History:  reports that she has quit smoking. She has never used smokeless tobacco. She reports that she does not drink alcohol or use illicit drugs.  Allergies:  Allergies  Allergen Reactions  . Codeine Hives and Nausea And Vomiting    ALSO SWEATING AND DIZZINESS  . Tomato Hives    Medications Prior to Admission  Medication Sig Dispense Refill  . cyclobenzaprine (FLEXERIL) 10 MG tablet Take 20 mg by mouth at bedtime.      . naproxen (NAPROSYN) 500 MG tablet Take 500 mg by mouth 2 (two) times daily with a meal.        No results found for this or any previous visit (from the past 48 hour(s)). No results found.  ROS  No recent f/c/n/v/wt loss  Blood pressure 125/88, pulse 115, temperature 99.8 F (37.7 C), temperature source Oral, height 5\' 3"  (1.6 m), weight 117.572 kg (259 lb 3.2 oz), last menstrual period 11/09/2013, SpO2 100.00%. Physical Exam  wn wd female in nad.  A and O x 4.  Mood and affect normal.  EOMI.  resp unlabored.  L foot with healthy skin.  No lymphadenopathy.  2+ dp and pt pulses.  Feels LT normally.  heelcord is tight.  Assessment/Plan L foot talonavicular arthritis and tight heelcord.  To OR for gastroc recession and  talonavicular joint arthrodesis.  The risks and benefits of the alternative treatment options have been discussed in detail.  The patient wishes to proceed with surgery and specifically understands risks of bleeding, infection, nerve damage, blood clots, need for additional surgery, amputation and death.   Wylene Simmer 2013-12-02, 8:17 AM

## 2013-11-21 NOTE — Transfer of Care (Signed)
Immediate Anesthesia Transfer of Care Note  Patient: Daisy [name]  Procedure(s) Performed: Procedure(s): LEFT GASTROC RECESSION (Left) LEFT TALONAVICULAR ARTHRODESIS (Left)  Patient Location: PACU  Anesthesia Type:GA combined with regional for post-op pain  Level of Consciousness: awake and patient cooperative  Airway & Oxygen Therapy: Patient Spontanous Breathing and Patient connected to face mask oxygen  Post-op Assessment: Report given to PACU RN and Post -op Vital signs reviewed and stable  Post vital signs: Reviewed and stable  Complications: No apparent anesthesia complications

## 2013-11-21 NOTE — Brief Op Note (Signed)
11/21/2013  10:37 AM  PATIENT:  Daisy [name]  33 y.o. female  PRE-OPERATIVE DIAGNOSIS:  LEFT TALON AVASCULAR ARTHRITIS/TIGHT HEEL CORD  POST-OPERATIVE DIAGNOSIS:  Same  Procedure(s): 1.  LEFT GASTROC RECESSION 2.  LEFT TALONAVICULAR ARTHRODESIS 3.  AP and lateral radiographs of the left foot  SURGEON:  Wylene Simmer, MD  ASSISTANT: n/a  ANESTHESIA:   General, regional  EBL:  minimal   TOURNIQUET:   Total Tourniquet Time Documented: Thigh (Left) - 60 minutes Total: Thigh (Left) - 60 minutes   COMPLICATIONS:  None apparent  DISPOSITION:  Extubated, awake and stable to recovery.  DICTATION ID:  [id number]

## 2013-11-21 NOTE — Anesthesia Postprocedure Evaluation (Signed)
  Anesthesia Post-op Note  Patient: Daisy [name]  Procedure(s) Performed: Procedure(s): LEFT GASTROC RECESSION (Left) LEFT TALONAVICULAR ARTHRODESIS (Left)  Patient Location: PACU  Anesthesia Type:GA combined with regional for post-op pain  Level of Consciousness: awake, alert  and oriented  Airway and Oxygen Therapy: Patient Spontanous Breathing  Post-op Pain: mild  Post-op Assessment: Post-op Vital signs reviewed  Post-op Vital Signs: Reviewed  Last Vitals:  Filed Vitals:   11/21/13 1130  BP: 139/93  Pulse: 117  Temp:   Resp: 16    Complications: No apparent anesthesia complications

## 2013-11-21 NOTE — Progress Notes (Signed)
Assisted Dr. Crews with left, ultrasound guided, popliteal/saphenous block. Side rails up, monitors on throughout procedure. See vital signs in flow sheet. Tolerated Procedure well. 

## 2013-11-21 NOTE — Anesthesia Procedure Notes (Addendum)
Anesthesia Regional Block:  Popliteal block  Pre-Anesthetic Checklist: ,, timeout performed, Correct Patient, Correct Site, Correct Laterality, Correct Procedure, Correct Position, site marked, Risks and benefits discussed,  Surgical consent,  Pre-op evaluation,  At surgeon's request and post-op pain management  Laterality: Left and Lower  Prep: chloraprep       Needles:  Injection technique: Single-shot  Needle Type: Echogenic Needle     Needle Length: 9cm 9 cm Needle Gauge: 21 and 21 G    Additional Needles:  Procedures: ultrasound guided (picture in chart) Popliteal block Narrative:  Start time: 11/21/2013 8:44 AM End time: 11/21/2013 8:50 AM Injection made incrementally with aspirations every 5 mL.  Performed by: Personally  Anesthesiologist: Lorrene Reid, MD   Anesthesia Regional Block:  Adductor canal block  Pre-Anesthetic Checklist: ,, timeout performed, Correct Patient, Correct Site, Correct Laterality, Correct Procedure, Correct Position, site marked, Risks and benefits discussed,  Surgical consent,  Pre-op evaluation,  At surgeon's request and post-op pain management  Laterality: Right and Lower  Prep: chloraprep       Needles:  Injection technique: Single-shot  Needle Type: Echogenic Needle     Needle Length: 9cm 9 cm Needle Gauge: 21 and 21 G    Additional Needles:  Procedures: ultrasound guided (picture in chart) Adductor canal block Narrative:  Start time: 11/21/2013 8:51 AM End time: 11/21/2013 8:55 AM Injection made incrementally with aspirations every 5 mL.  Performed by: Personally  Anesthesiologist: Lorrene Reid, MD   Procedure Name: LMA Insertion Date/Time: 11/21/2013 9:03 AM Performed by: Jarett Dralle Pre-anesthesia Checklist: Patient identified, Emergency Drugs available, Suction available and Patient being monitored Patient Re-evaluated:Patient Re-evaluated prior to inductionOxygen Delivery Method: Circle System  Utilized Preoxygenation: Pre-oxygenation with 100% oxygen Intubation Type: IV induction Ventilation: Mask ventilation without difficulty LMA: LMA inserted LMA Size: 4.0 Number of attempts: 1 Airway Equipment and Method: bite block Placement Confirmation: positive ETCO2 Tube secured with: Tape Dental Injury: Teeth and Oropharynx as per pre-operative assessment

## 2013-11-22 ENCOUNTER — Encounter (HOSPITAL_BASED_OUTPATIENT_CLINIC_OR_DEPARTMENT_OTHER): Payer: Self-pay | Admitting: Orthopedic Surgery

## 2013-11-22 NOTE — Discharge Instructions (Signed)
Wylene Simmer, MD [place]  Please read the following information regarding your care after surgery.  Medications  You only need a prescription for the narcotic pain medicine (ex. oxycodone, Percocet, Norco).  All of the other medicines listed below are available over the counter. X acetominophen (Tylenol) 650 mg every 4-6 hours as you need for minor pain X dilaudid as prescribed for moderate to severe pain   Narcotic pain medicine (ex. oxycodone, Percocet, Vicodin) will cause constipation.  To prevent this problem, take the following medicines while you are taking any pain medicine. X docusate sodium (Colace) 100 mg twice a day X senna (Senokot) 2 tablets twice a day  X To help prevent blood clots, take an aspirin (325 mg) once a day for a month after surgery.  You should also get up every hour while you are awake to move around.    Weight Bearing ? Bear weight when you are able on your operated leg or foot. ? Bear weight only on the heel of your operated foot in the post-op shoe. X Do not bear any weight on the operated leg or foot.  Cast / Splint / Dressing X Keep your splint or cast clean and dry.  Dont put anything (coat hanger, pencil, etc) down inside of it.  If it gets damp, use a hair dryer on the cool setting to dry it.  If it gets soaked, call the office to schedule an appointment for a cast change. ? Remove your dressing 3 days after surgery and cover the incisions with dry dressings.    After your dressing, cast or splint is removed; you may shower, but do not soak or scrub the wound.  Allow the water to run over it, and then gently pat it dry.  Swelling It is normal for you to have swelling where you had surgery.  To reduce swelling and pain, keep your toes above your nose for at least 3 days after surgery.  It may be necessary to keep your foot or leg elevated for several weeks.  If it hurts, it should be elevated.  Follow Up Call my office at [phone]  when you are discharged from the hospital or surgery center to schedule an appointment to be seen two weeks after surgery.  Call my office at [phone] if you develop a fever >101.5 F, nausea, vomiting, bleeding from the surgical site or severe pain.     Regional Anesthesia Blocks  1. Numbness or the inability to move the "blocked" extremity may last from 3-48 hours after placement. The length of time depends on the medication injected and your individual response to the medication. If the numbness is not going away after 48 hours, call your surgeon.  2. The extremity that is blocked will need to be protected until the numbness is gone and the  Strength has returned. Because you cannot feel it, you will need to take extra care to avoid injury. Because it may be weak, you may have difficulty moving it or using it. You may not know what position it is in without looking at it while the block is in effect.  3. For blocks in the legs and feet, returning to weight bearing and walking needs to be done carefully. You will need to wait until the numbness is entirely gone and the strength has returned. You should be able to move your leg and foot normally before you try and bear weight or walk. You will need someone to be with  you when you first try to ensure you do not fall and possibly risk injury.  4. Bruising and tenderness at the needle site are common side effects and will resolve in a few days.  5. Persistent numbness or new problems with movement should be communicated to the surgeon or the [place] [phone] [place] [phone]).

## 2013-11-22 NOTE — Op Note (Signed)
NAMEFRANCESA, EUGENIO                 ACCOUNT NO.:  [id number]  MEDICAL RECORD NO.:  [id number]  LOCATION:                                 FACILITY:  PHYSICIAN:  Wylene Simmer, MD        DATE OF BIRTH:  Jun 20, 1981  DATE OF PROCEDURE:  11/21/2013 DATE OF DISCHARGE:                              OPERATIVE REPORT   PREOPERATIVE DIAGNOSIS:  Left talonavicular joint arthritis and tight left heel cord.  POSTOPERATIVE DIAGNOSIS:  Left talonavicular joint arthritis and tight left heel cord.  PROCEDURE: 1. Left gastrocnemius recession through a separate incision. 2. Left talonavicular joint arthrodesis. 3. AP and lateral radiographs of the left foot.  SURGEON:  Wylene Simmer, MD  ANESTHESIA:  General, regional.  ESTIMATED BLOOD LOSS:  Minimal.  TOURNIQUET TIME:  60 minutes at 300 mmHg.  COMPLICATIONS:  None apparent.  DISPOSITION:  Extubated, awake, and stable to recovery.  INDICATIONS FOR PROCEDURE:  The patient is a 33 year old female with past medical history significant for rheumatoid arthritis.  She has symptomatic talonavicular joint arthritis and has responded appropriately to a diagnostic injection.  She presents now for arthrodesis of the talonavicular joint.  She has a tight heel cord, and will require gastrocnemius recession as well.  She understands the risks and benefits, the alternative treatment options and elects surgical treatment.  She specifically understands risks of bleeding, infection, nerve damage, blood clots, need for additional surgery, amputation, and death.  PROCEDURE IN DETAIL:  After preoperative consent was obtained, the correct operative site was identified, the patient was brought to the operating room and placed supine on the operating table.  General anesthesia was induced.  Preoperative antibiotics were administered. Surgical time-out was taken.  Left lower extremity was prepped and draped in standard sterile fashion the tourniquet around the  thigh.  The extremity was exsanguinated.  The tourniquet was inflated to 300 mmHg. A longitudinal incision was then made over the calf medially.  Sharp dissection was carried down through skin and subcutaneous tissue, superficial fascia was incised.  The plantaris tendon was identified and was transected under direct vision.  The gastrocnemius tendon was then identified and mobilized deeply and superficially.  Care was taken to protect the sural nerve posteriorly.  The gastrocnemius tendon was then divided from medial to lateral under direct vision.  The ankle was then dorsiflexed 30 degrees with the knee extended.  The wound was irrigated copiously.  The subcutaneous tissue was closed with Monocryl and the skin was closed with a running nylon.  Attention was then turned to the dorsum of the foot where longitudinal incision was made over the talonavicular joint.  Sharp dissection was carried down through the skin and subcutaneous tissue.  The interval between the tibialis anterior and extensor hallucis longus was then developed.  Talonavicular joint was identified.  The joint capsule was released medially and laterally exposing the joint.  A Joker elevator was inserted to open the joint, followed by a lamina spreader.  There was essentially no remaining cartilage evident within the joint. Subchondral bone was then scraped with a large curette.  The wound was irrigated copiously.  The subchondral bone was then  perforated with a 2.5-mm drill bit over the entire joint surface proximally and distally. A quarter-inch curved osteotome was used to break up the remaining bone between the drill holes.  Approximately 1.5 mL of demineralized bone matrix was packed into the joint to fill in all of the holes.  The joint was reduced and pinned provisionally.  A K-wire for the Biomet 5-mm cannulated screw set was then inserted from the navicular across the talonavicular joint into the talar head.  The  separate pin was placed laterally from the dorsal incision.  The medial had been placed through a stab incision.  AP and lateral radiographs confirmed appropriate reduction of the joint and appropriate position of both guide pins. Both guide pins were then utilized to insert a partially threaded 5 mm cannulated screws.  This had compressed the joint appropriately.  The final AP and lateral radiographs confirmed appropriate reduction of the talonavicular joint, appropriate position and length of both screws. The small gap at the dorsum of the talonavicular joint was then filled with the remaining bone graft.  The wound was irrigated copiously.  The anterior joint capsule was closed over the ankle and talonavicular joint with simple sutures of 2-0 Vicryl.  The retinaculum was closed with Vicryl as well.  Subcutaneous tissue was closed with Monocryl and the skin incision was closed with nylon.  Sterile dressings were applied followed by a well-padded short-leg splint.  Tourniquet was released at 1 hour.  The patient was awakened from anesthesia and transported to the recovery room in stable condition.  FOLLOWUP PLAN:  The patient will be nonweightbearing on the left lower extremity.  She will remain overnight for observation, for pain control. She will follow up with me in 2 weeks for suture removal and conversion to a cast.  RADIOGRAPHS:  AP and lateral radiographs of the left foot are obtained intraoperatively.  These showed interval arthrodesis of the talonavicular joint, with 2 screws across the joint.  No acute injury is noted.     Wylene Simmer, MD     JH/MEDQ  D:  11/21/2013  T:  11/22/2013  Job:  [id number]

## 2013-12-27 ENCOUNTER — Other Ambulatory Visit: Payer: Self-pay | Admitting: Family Medicine

## 2014-01-08 ENCOUNTER — Other Ambulatory Visit: Payer: Self-pay | Admitting: Family Medicine

## 2014-01-08 DIAGNOSIS — N939 Abnormal uterine and vaginal bleeding, unspecified: Secondary | ICD-10-CM

## 2014-01-08 DIAGNOSIS — R102 Pelvic and perineal pain: Secondary | ICD-10-CM

## 2014-01-08 DIAGNOSIS — N926 Irregular menstruation, unspecified: Secondary | ICD-10-CM

## 2014-01-16 ENCOUNTER — Ambulatory Visit
Admission: RE | Admit: 2014-01-16 | Discharge: 2014-01-16 | Disposition: A | Discharge status: Home / Self Care | Payer: BC Managed Care – PPO | Source: Ambulatory Visit | Attending: Family Medicine | Admitting: Family Medicine

## 2014-01-16 DIAGNOSIS — N926 Irregular menstruation, unspecified: Secondary | ICD-10-CM

## 2014-01-16 DIAGNOSIS — N939 Abnormal uterine and vaginal bleeding, unspecified: Secondary | ICD-10-CM

## 2014-01-16 DIAGNOSIS — R102 Pelvic and perineal pain: Secondary | ICD-10-CM

## 2014-03-20 ENCOUNTER — Emergency Department (HOSPITAL_BASED_OUTPATIENT_CLINIC_OR_DEPARTMENT_OTHER): Payer: BC Managed Care – PPO

## 2014-03-20 ENCOUNTER — Emergency Department (HOSPITAL_BASED_OUTPATIENT_CLINIC_OR_DEPARTMENT_OTHER)
Admission: EM | Admit: 2014-03-20 | Discharge: 2014-03-20 | Disposition: A | Discharge status: Home / Self Care | Payer: BC Managed Care – PPO | Attending: Emergency Medicine | Admitting: Emergency Medicine

## 2014-03-20 ENCOUNTER — Encounter (HOSPITAL_BASED_OUTPATIENT_CLINIC_OR_DEPARTMENT_OTHER): Payer: Self-pay | Admitting: Emergency Medicine

## 2014-03-20 DIAGNOSIS — M79609 Pain in unspecified limb: Secondary | ICD-10-CM | POA: Diagnosis present

## 2014-03-20 DIAGNOSIS — M069 Rheumatoid arthritis, unspecified: Secondary | ICD-10-CM | POA: Insufficient documentation

## 2014-03-20 DIAGNOSIS — M25579 Pain in unspecified ankle and joints of unspecified foot: Secondary | ICD-10-CM | POA: Diagnosis not present

## 2014-03-20 DIAGNOSIS — Z87891 Personal history of nicotine dependence: Secondary | ICD-10-CM | POA: Diagnosis not present

## 2014-03-20 DIAGNOSIS — M79672 Pain in left foot: Secondary | ICD-10-CM

## 2014-03-20 DIAGNOSIS — Z7982 Long term (current) use of aspirin: Secondary | ICD-10-CM | POA: Insufficient documentation

## 2014-03-20 DIAGNOSIS — Z981 Arthrodesis status: Secondary | ICD-10-CM | POA: Diagnosis not present

## 2014-03-20 DIAGNOSIS — Z79899 Other long term (current) drug therapy: Secondary | ICD-10-CM | POA: Diagnosis not present

## 2014-03-20 DIAGNOSIS — Z8679 Personal history of other diseases of the circulatory system: Secondary | ICD-10-CM | POA: Insufficient documentation

## 2014-03-20 MED ORDER — HYDROMORPHONE HCL 2 MG PO TABS: 2.0000 mg | ORAL_TABLET | ORAL | Status: DC | PRN

## 2014-03-20 NOTE — Discharge Instructions (Signed)
Please make an appointment with the surgeon who operated on her foot so that he can evaluate hip and see what is the cause of your pain. In the meantime, use crutches as needed.  Hydromorphone tablets What is this medicine? HYDROMORPHONE (hye droe MOR fone) is a pain reliever. It is used to treat moderate to severe pain. This medicine may be used for other purposes; ask your health care provider or pharmacist if you have questions. COMMON BRAND NAME(S): Dilaudid What should I tell my health care provider before I take this medicine? They need to know if you have any of these conditions: -brain tumor -drug abuse or addiction -head injury -heart disease -frequently drink alcohol containing drinks -kidney disease or problems going to the bathroom -liver disease -lung disease, asthma, or breathing problems -mental problems -an allergic or unusual reaction to lactose, hydromorphone, other opioid analgesics, other medicines, sulfites, foods, dyes, or preservatives -pregnant or trying to get pregnant -breast-feeding How should I use this medicine? Take this medicine by mouth with a glass of water. If the medicine upsets your stomach, take it with food or milk. Follow the directions on the prescription label. Do not take more medicine than you are told to take. Talk to your pediatrician regarding the use of this medicine in children. Special care may be needed. Overdosage: If you think you have taken too much of this medicine contact a poison control center or emergency room at once. NOTE: This medicine is only for you. Do not share this medicine with others. What if I miss a dose? If you miss a dose, take it as soon as you can. If it is almost time for your next dose, take only that dose. Do not take double or extra doses. What may interact with this medicine? -alcohol -antihistamines for allergy, cough and cold -medicines for anesthesia -medicines for depression, anxiety, or psychotic  disturbances -medicines for sleep -muscle relaxants -naltrexone -narcotic medicines (opiates) for pain -phenothiazines like chlorpromazine, mesoridazine, prochlorperazine, thioridazine -tramadol This list may not describe all possible interactions. Give your health care provider a list of all the medicines, herbs, non-prescription drugs, or dietary supplements you use. Also tell them if you smoke, drink alcohol, or use illegal drugs. Some items may interact with your medicine. What should I watch for while using this medicine? Tell your doctor or health care professional if your pain does not go away, if it gets worse, or if you have new or a different type of pain. You may develop tolerance to the medicine. Tolerance means that you will need a higher dose of the medicine for pain relief. Tolerance is normal and is expected if you take this medicine for a [place] time. Do not suddenly stop taking your medicine because you may develop a severe reaction. Your body becomes used to the medicine. This does NOT mean you are addicted. Addiction is a behavior related to getting and using a drug for a non-medical reason. If you have pain, you have a medical reason to take pain medicine. Your doctor will tell you how much medicine to take. If your doctor wants you to stop the medicine, the dose will be slowly lowered over time to avoid any side effects. You may get drowsy or dizzy. Do not drive, use machinery, or do anything that needs mental alertness until you know how this medicine affects you. Do not stand or sit up quickly, especially if you are an older patient. This reduces the risk of dizzy or fainting  spells. Alcohol may interfere with the effect of this medicine. Avoid alcoholic drinks. There are different types of narcotic medicines (opiates) for pain. If you take more than one type at the same time, you may have more side effects. Give your health care provider a list of all medicines you use. Your doctor  will tell you how much medicine to take. Do not take more medicine than directed. Call emergency for help if you have problems breathing. This medicine will cause constipation. Try to have a bowel movement at least every 2 to 3 days. If you do not have a bowel movement for 3 days, call your doctor or health care professional. Your mouth may get dry. Chewing sugarless gum or sucking hard candy, and drinking plenty of water may help. Contact your doctor if the problem does not go away or is severe. What side effects may I notice from receiving this medicine? Side effects that you should report to your doctor or health care professional as soon as possible: -allergic reactions like skin rash, itching or hives, swelling of the face, lips, or tongue -breathing problems -changes in vision -confusion -feeling faint or lightheaded, falls -seizures -slow or fast heartbeat -trouble passing urine or change in the amount of urine -trouble with balance, talking, walking -unusually weak or tired Side effects that usually do not require medical attention (report to your doctor or health care professional if they continue or are bothersome): -difficulty sleeping -drowsiness -dry mouth -flushing -headache -itching -loss of appetite -nausea, vomiting This list may not describe all possible side effects. Call your doctor for medical advice about side effects. You may report side effects to FDA at 1-800-FDA-1088. Where should I keep my medicine? Keep out of the reach of children. This medicine can be abused. Keep your medicine in a safe place to protect it from theft. Do not share this medicine with anyone. Selling or giving away this medicine is dangerous and against the law. Store at room temperature between 15 and 30 degrees C (59 and 86 degrees F). Keep container tightly closed. Protect from light. This medicine may cause accidental overdose and death if it is taken by other adults, children, or pets.  Flush any unused medicine down the toilet to reduce the chance of harm. Do not use the medicine after the expiration date. NOTE: This sheet is a summary. It may not cover all possible information. If you have questions about this medicine, talk to your doctor, pharmacist, or health care provider.  2015, [employer]. (2013-02-05 09:50:15)

## 2014-03-20 NOTE — ED Provider Notes (Signed)
CSN: [id number]     Arrival date & time 03/20/14  1752 History   First MD Initiated Contact with Patient 03/20/14 1831     This chart was scribed for Delora Fuel, MD by Forrestine Him, ED Scribe. This patient was seen in room MH12/MH12 and the patient's care was started 6:34 PM.   Chief Complaint  Patient presents with  . Foot Pain   The history is provided by the patient. No language interpreter was used.    HPI Comments: Daisy [name] is a 33 y.o. female with a PMHx of rheumatoid arthritis who presents to the Emergency Department complaining of constant, moderate L foot pain x 1 week. Pt states pain has progressively worsened throughout this week. PSHx includes surgery to L foot approximately 3 months ago performed by Dr. Wylene Simmer. States discomfort recently came on after returning to work on 03/03/2014 after surgery. P states discomfort is "tight". She denies any alleviating factors at this time, and states pain is exacerbated with ambulation and weight bearing. Pt currently rates pain 7/10 at rest and 10/10 when ambulating. No fever or chills. She mentions stopping Enbrel and Methotrexate medications in May of this year for rheumatoid arthritis. Pt with allergy to Codeine. No other concerns this visit.   Past Medical History  Diagnosis Date  . Migraines   . Arthritis 11/2013    avascular arthritis left talon  . Rheumatoid arthritis   . Tightness of left heel cord 11/2013   Past Surgical History  Procedure Laterality Date  . Cesarean section  11/16/2002  . Tubal ligation  07/27/2005    laparoscopic  . Gastrocnemius recession Left 11/21/2013    Procedure: LEFT GASTROC RECESSION;  Surgeon: Wylene Simmer, MD;  Location: [place];  Service: Orthopedics;  Laterality: Left;  . Ankle fusion Left 11/21/2013    Procedure: LEFT TALONAVICULAR ARTHRODESIS;  Surgeon: Wylene Simmer, MD;  Location: [facility];  Service: Orthopedics;  Laterality: Left;   Family History  Problem  Relation Age of Onset  . Cancer Mother   . Cancer Father   . Asthma Daughter   . Asthma Son    History  Substance Use Topics  . Smoking status: Former Research scientist (life sciences)  . Smokeless tobacco: Never Used     Comment: quit smoking 2010  . Alcohol Use: No   OB History   Grav Para Term Preterm Abortions TAB SAB Ect Mult Living                 Review of Systems  Constitutional: Negative for fever and chills.  Musculoskeletal: Positive for arthralgias.  All other systems reviewed and are negative.     Allergies  Codeine and Tomato  Home Medications   Prior to Admission medications   Medication Sig Start Date End Date Taking? Authorizing Provider  aspirin EC 325 MG tablet Take 1 tablet (325 mg total) by mouth daily. 11/21/13   Wylene Simmer, MD  docusate sodium (COLACE) 100 MG capsule Take 1 capsule (100 mg total) by mouth 2 (two) times daily. While taking narcotic pain medicine. 11/21/13   Wylene Simmer, MD  HYDROmorphone (DILAUDID) 2 MG tablet Take 1-2 tablets (2-4 mg total) by mouth every 4 (four) hours as needed for moderate pain or severe pain. 11/21/13   Wylene Simmer, MD  promethazine (PHENERGAN) 12.5 MG tablet Take 1 tablet (12.5 mg total) by mouth every 6 (six) hours as needed for nausea or vomiting. 11/21/13   Wylene Simmer, MD  senna (  SENOKOT) 8.6 MG tablet Take 2 tablets (17.2 mg total) by mouth daily. While taking narcotic pain medicine. 11/21/13   Wylene Simmer, MD  valACYclovir (VALTREX) 500 MG tablet Take 1 tablet (500 mg total) by mouth 2 (two) times daily. PATIENT NEEDS OFFICE VISIT FOR ADDITIONAL REFILLS 12/27/13   Posey Boyer, MD   Triage Vitals: BP 144/79  Pulse 78  Temp(Src) 98.1 F (36.7 C) (Oral)  Resp 16  SpO2 100%  LMP 03/18/2014   Physical Exam  Nursing note and vitals reviewed. Constitutional: She is oriented to person, place, and time. She appears well-developed and well-nourished.  HENT:  Head: Normocephalic.  Eyes: EOM are normal.  Neck: Normal range of motion.   Cardiovascular: Normal rate, regular rhythm and normal heart sounds.   Pulmonary/Chest: Effort normal and breath sounds normal.  Abdominal: She exhibits no distension.  Musculoskeletal: Normal range of motion. She exhibits tenderness.  Tenderness to lateral aspect of L foot and of instep of L foot  Neurological: She is alert and oriented to person, place, and time.  Psychiatric: She has a normal mood and affect.    ED Course  Procedures (including critical care time)  DIAGNOSTIC STUDIES: Oxygen Saturation is 100% on RA, Normal by my interpretation.    COORDINATION OF CARE: 6:33 PM- Will order DG foot complete L. Discussed treatment plan with pt at bedside and pt agreed to plan.    Imaging Review Dg Foot Complete Left  03/20/2014   CLINICAL DATA:  pain  EXAM: LEFT FOOT - COMPLETE 3+ VIEW  COMPARISON:  MR 09/19/2013 and earlier studies  FINDINGS: Interval instrumented arthrodesis across the talonavicular joint. Hardware intact without surrounding lucency. Alignment preserved. There is mild juxta-articular demineralization at the base of metatarsals. Normal alignment. Negative for fracture or dislocation. Small calcaneal spur at the plantar aponeurosis.  IMPRESSION: 1. Negative for fracture or other acute bony injury. 2. Postoperative changes as above.   Electronically Signed   By: Arne Cleveland M.D.   On: 03/20/2014 19:24   MDM   Final diagnoses:  Pain in left foot    Foot pain in a person who is 4 months post ankle arthrodesis. There is no recent injury and I suspect pain is related to recent surgery. X-ray shows no acute process. Old records are reviewed and she had arthrodesis done in May. She is referred back to her orthopedic physician for reevaluation and is given a prescription for hydromorphone for pain.  I personally performed the services described in this documentation, which was scribed in my presence. The recorded information has been reviewed and is accurate.    Delora Fuel, MD 86/38/17 [id number]

## 2014-03-20 NOTE — ED Notes (Signed)
The patient reports that she had surgery 3 months ago to her left foot. Patient reports that she has gone back to work and now she feels like her foot is tight and it is hurting around the surgical site

## 2014-03-20 NOTE — ED Notes (Signed)
Pt returned from xray

## 2014-04-03 ENCOUNTER — Encounter (HOSPITAL_COMMUNITY): Payer: Self-pay | Admitting: Pharmacist

## 2014-04-07 ENCOUNTER — Other Ambulatory Visit: Payer: Self-pay | Admitting: Obstetrics and Gynecology

## 2014-04-08 ENCOUNTER — Encounter (HOSPITAL_COMMUNITY): Payer: Self-pay | Admitting: *Deleted

## 2014-04-11 ENCOUNTER — Other Ambulatory Visit: Payer: Self-pay | Admitting: Obstetrics and Gynecology

## 2014-04-17 ENCOUNTER — Encounter (HOSPITAL_COMMUNITY): Payer: Self-pay

## 2014-04-17 ENCOUNTER — Encounter (HOSPITAL_COMMUNITY): Payer: BC Managed Care – PPO | Admitting: Anesthesiology

## 2014-04-17 ENCOUNTER — Ambulatory Visit (HOSPITAL_COMMUNITY)
Admission: RE | Admit: 2014-04-17 | Discharge: 2014-04-17 | Disposition: A | Discharge status: Home / Self Care | Payer: BC Managed Care – PPO | Source: Ambulatory Visit | Attending: Obstetrics and Gynecology | Admitting: Obstetrics and Gynecology

## 2014-04-17 ENCOUNTER — Encounter (HOSPITAL_COMMUNITY)
Admission: RE | Disposition: A | Discharge status: Home / Self Care | Payer: Self-pay | Source: Ambulatory Visit | Attending: Obstetrics and Gynecology

## 2014-04-17 ENCOUNTER — Ambulatory Visit (HOSPITAL_COMMUNITY): Payer: BC Managed Care – PPO | Admitting: Anesthesiology

## 2014-04-17 DIAGNOSIS — E669 Obesity, unspecified: Secondary | ICD-10-CM | POA: Insufficient documentation

## 2014-04-17 DIAGNOSIS — G43909 Migraine, unspecified, not intractable, without status migrainosus: Secondary | ICD-10-CM | POA: Insufficient documentation

## 2014-04-17 DIAGNOSIS — N920 Excessive and frequent menstruation with regular cycle: Secondary | ICD-10-CM | POA: Diagnosis not present

## 2014-04-17 DIAGNOSIS — Z6841 Body Mass Index (BMI) 40.0 and over, adult: Secondary | ICD-10-CM | POA: Diagnosis not present

## 2014-04-17 DIAGNOSIS — N8320 Unspecified ovarian cysts: Secondary | ICD-10-CM | POA: Diagnosis present

## 2014-04-17 DIAGNOSIS — M069 Rheumatoid arthritis, unspecified: Secondary | ICD-10-CM | POA: Diagnosis not present

## 2014-04-17 DIAGNOSIS — D279 Benign neoplasm of unspecified ovary: Secondary | ICD-10-CM | POA: Insufficient documentation

## 2014-04-17 DIAGNOSIS — D649 Anemia, unspecified: Secondary | ICD-10-CM | POA: Insufficient documentation

## 2014-04-17 HISTORY — PX: BILATERAL SALPINGECTOMY: SHX5743

## 2014-04-17 HISTORY — PX: DILITATION & CURRETTAGE/HYSTROSCOPY WITH NOVASURE ABLATION: SHX5568

## 2014-04-17 HISTORY — PX: LAPAROSCOPIC OVARIAN CYSTECTOMY: SHX6248

## 2014-04-17 LAB — CBC
HCT: 37.7 % (ref 36.0–46.0)
Hemoglobin: 12.2 g/dL (ref 12.0–15.0)
MCH: 26 pg (ref 26.0–34.0)
MCHC: 32.4 g/dL (ref 30.0–36.0)
MCV: 80.2 fL (ref 78.0–100.0)
Platelets: 345 10*3/uL (ref 150–400)
RBC: 4.7 MIL/uL (ref 3.87–5.11)
RDW: 13 % (ref 11.5–15.5)
WBC: 8.1 10*3/uL (ref 4.0–10.5)

## 2014-04-17 LAB — PREGNANCY, URINE: PREG TEST UR: NEGATIVE

## 2014-04-17 SURGERY — EXCISION, CYST, OVARY, LAPAROSCOPIC
Anesthesia: General

## 2014-04-17 MED ORDER — METOCLOPRAMIDE HCL 5 MG/ML IJ SOLN
INTRAMUSCULAR | Status: AC
  Administered 2014-04-17: 10 mg via INTRAVENOUS
  Filled 2014-04-17: qty 2

## 2014-04-17 MED ORDER — PHENYLEPHRINE HCL 10 MG/ML IJ SOLN
INTRAMUSCULAR | Status: DC | PRN
  Administered 2014-04-17 (×2): 80 ug via INTRAVENOUS

## 2014-04-17 MED ORDER — ACETAMINOPHEN 160 MG/5ML PO SOLN
975.0000 mg | Freq: Once | ORAL | Status: AC
  Administered 2014-04-17: 975 mg via ORAL

## 2014-04-17 MED ORDER — IBUPROFEN 800 MG PO TABS: 800.0000 mg | ORAL_TABLET | Freq: Three times a day (TID) | ORAL | Status: DC | PRN

## 2014-04-17 MED ORDER — MIDAZOLAM HCL 2 MG/2ML IJ SOLN
INTRAMUSCULAR | Status: DC | PRN
  Administered 2014-04-17: 2 mg via INTRAVENOUS

## 2014-04-17 MED ORDER — LACTATED RINGERS IV SOLN
INTRAVENOUS | Status: DC
  Administered 2014-04-17 (×3): via INTRAVENOUS

## 2014-04-17 MED ORDER — METHYLPREDNISOLONE SODIUM SUCC 125 MG IJ SOLR
INTRAMUSCULAR | Status: AC
  Filled 2014-04-17: qty 2

## 2014-04-17 MED ORDER — SCOPOLAMINE 1 MG/3DAYS TD PT72
1.0000 | MEDICATED_PATCH | Freq: Once | TRANSDERMAL | Status: DC
  Administered 2014-04-17: 1.5 mg via TRANSDERMAL

## 2014-04-17 MED ORDER — HEPARIN SODIUM (PORCINE) 5000 UNIT/ML IJ SOLN
INTRAMUSCULAR | Status: AC
  Filled 2014-04-17: qty 1

## 2014-04-17 MED ORDER — PROPOFOL 10 MG/ML IV BOLUS
INTRAVENOUS | Status: DC | PRN
  Administered 2014-04-17: 200 mg via INTRAVENOUS

## 2014-04-17 MED ORDER — FENTANYL CITRATE 0.05 MG/ML IJ SOLN
INTRAMUSCULAR | Status: AC
  Filled 2014-04-17: qty 5

## 2014-04-17 MED ORDER — METOCLOPRAMIDE HCL 5 MG/ML IJ SOLN
INTRAMUSCULAR | Status: AC
  Filled 2014-04-17: qty 2

## 2014-04-17 MED ORDER — ONDANSETRON HCL 4 MG/2ML IJ SOLN
INTRAMUSCULAR | Status: DC | PRN
  Administered 2014-04-17: 4 mg via INTRAVENOUS

## 2014-04-17 MED ORDER — SCOPOLAMINE 1 MG/3DAYS TD PT72
MEDICATED_PATCH | TRANSDERMAL | Status: AC
  Administered 2014-04-17: 1.5 mg via TRANSDERMAL
  Filled 2014-04-17: qty 1

## 2014-04-17 MED ORDER — PROMETHAZINE HCL 12.5 MG PO TABS: 12.5000 mg | ORAL_TABLET | Freq: Four times a day (QID) | ORAL | Status: DC | PRN

## 2014-04-17 MED ORDER — NEOSTIGMINE METHYLSULFATE 10 MG/10ML IV SOLN
INTRAVENOUS | Status: DC | PRN
  Administered 2014-04-17: 5 mg via INTRAVENOUS

## 2014-04-17 MED ORDER — PROMETHAZINE HCL 25 MG/ML IJ SOLN: 6.2500 mg | INTRAMUSCULAR | Status: DC | PRN

## 2014-04-17 MED ORDER — KETOROLAC TROMETHAMINE 30 MG/ML IJ SOLN
INTRAMUSCULAR | Status: DC | PRN
  Administered 2014-04-17: 30 mg via INTRAVENOUS

## 2014-04-17 MED ORDER — MIDAZOLAM HCL 2 MG/2ML IJ SOLN
INTRAMUSCULAR | Status: AC
  Filled 2014-04-17: qty 2

## 2014-04-17 MED ORDER — ACETAMINOPHEN 160 MG/5ML PO SOLN
ORAL | Status: AC
  Administered 2014-04-17: 975 mg via ORAL
  Filled 2014-04-17: qty 40.6

## 2014-04-17 MED ORDER — GLYCOPYRROLATE 0.2 MG/ML IJ SOLN
INTRAMUSCULAR | Status: AC
  Filled 2014-04-17: qty 4

## 2014-04-17 MED ORDER — BUPIVACAINE-EPINEPHRINE (PF) 0.5% -1:200000 IJ SOLN
INTRAMUSCULAR | Status: AC
  Filled 2014-04-17: qty 30

## 2014-04-17 MED ORDER — HYDROMORPHONE HCL 1 MG/ML IJ SOLN
INTRAMUSCULAR | Status: DC | PRN
  Administered 2014-04-17 (×2): 1 mg via INTRAVENOUS

## 2014-04-17 MED ORDER — ROCURONIUM BROMIDE 100 MG/10ML IV SOLN
INTRAVENOUS | Status: DC | PRN
  Administered 2014-04-17: 50 mg via INTRAVENOUS
  Administered 2014-04-17: 20 mg via INTRAVENOUS
  Administered 2014-04-17 (×3): 10 mg via INTRAVENOUS

## 2014-04-17 MED ORDER — DEXAMETHASONE SODIUM PHOSPHATE 4 MG/ML IJ SOLN
INTRAMUSCULAR | Status: AC
  Filled 2014-04-17: qty 1

## 2014-04-17 MED ORDER — HYDROMORPHONE HCL 2 MG PO TABS: 2.0000 mg | ORAL_TABLET | ORAL | Status: DC | PRN

## 2014-04-17 MED ORDER — METOCLOPRAMIDE HCL 5 MG/ML IJ SOLN
10.0000 mg | Freq: Once | INTRAMUSCULAR | Status: AC
  Administered 2014-04-17: 10 mg via INTRAVENOUS

## 2014-04-17 MED ORDER — ONDANSETRON HCL 4 MG/2ML IJ SOLN
INTRAMUSCULAR | Status: AC
  Filled 2014-04-17: qty 2

## 2014-04-17 MED ORDER — NEOSTIGMINE METHYLSULFATE 10 MG/10ML IV SOLN
INTRAVENOUS | Status: AC
  Filled 2014-04-17: qty 1

## 2014-04-17 MED ORDER — KETOROLAC TROMETHAMINE 30 MG/ML IJ SOLN
INTRAMUSCULAR | Status: AC
  Filled 2014-04-17: qty 1

## 2014-04-17 MED ORDER — LIDOCAINE HCL (CARDIAC) 20 MG/ML IV SOLN
INTRAVENOUS | Status: AC
  Filled 2014-04-17: qty 5

## 2014-04-17 MED ORDER — BUPIVACAINE-EPINEPHRINE 0.5% -1:200000 IJ SOLN
INTRAMUSCULAR | Status: DC | PRN
  Administered 2014-04-17: 30 mL

## 2014-04-17 MED ORDER — FENTANYL CITRATE 0.05 MG/ML IJ SOLN
INTRAMUSCULAR | Status: DC | PRN
  Administered 2014-04-17: 150 ug via INTRAVENOUS
  Administered 2014-04-17 (×2): 50 ug via INTRAVENOUS

## 2014-04-17 MED ORDER — MEPERIDINE HCL 25 MG/ML IJ SOLN: 6.2500 mg | INTRAMUSCULAR | Status: DC | PRN

## 2014-04-17 MED ORDER — HYDROMORPHONE HCL 1 MG/ML IJ SOLN
INTRAMUSCULAR | Status: AC
  Filled 2014-04-17: qty 1

## 2014-04-17 MED ORDER — FENTANYL CITRATE 0.05 MG/ML IJ SOLN: 25.0000 ug | INTRAMUSCULAR | Status: DC | PRN

## 2014-04-17 MED ORDER — PROPOFOL 10 MG/ML IV EMUL
INTRAVENOUS | Status: AC
  Filled 2014-04-17: qty 20

## 2014-04-17 MED ORDER — METHYLPREDNISOLONE SODIUM SUCC 125 MG IJ SOLR
INTRAMUSCULAR | Status: DC | PRN
  Administered 2014-04-17: 125 mg via INTRAVENOUS

## 2014-04-17 MED ORDER — KETOROLAC TROMETHAMINE 30 MG/ML IJ SOLN: 15.0000 mg | Freq: Once | INTRAMUSCULAR | Status: DC | PRN

## 2014-04-17 MED ORDER — GLYCOPYRROLATE 0.2 MG/ML IJ SOLN
INTRAMUSCULAR | Status: DC | PRN
  Administered 2014-04-17: .8 mg via INTRAVENOUS

## 2014-04-17 MED ORDER — ROCURONIUM BROMIDE 100 MG/10ML IV SOLN
INTRAVENOUS | Status: AC
  Filled 2014-04-17: qty 1

## 2014-04-17 MED ORDER — LIDOCAINE HCL (CARDIAC) 20 MG/ML IV SOLN
INTRAVENOUS | Status: DC | PRN
  Administered 2014-04-17: 100 mg via INTRAVENOUS

## 2014-04-17 MED ORDER — PHENYLEPHRINE 40 MCG/ML (10ML) SYRINGE FOR IV PUSH (FOR BLOOD PRESSURE SUPPORT)
PREFILLED_SYRINGE | INTRAVENOUS | Status: AC
  Filled 2014-04-17: qty 5

## 2014-04-17 SURGICAL SUPPLY — 41 items
CABLE HIGH FREQUENCY MONO STRZ (ELECTRODE) IMPLANT
CANISTER SUCT 3000ML (MISCELLANEOUS) ×5 IMPLANT
CATH ROBINSON RED A/P 16FR (CATHETERS) ×5 IMPLANT
CHLORAPREP W/TINT 26ML (MISCELLANEOUS) ×5 IMPLANT
CLOSURE WOUND 1/4 X3 (GAUZE/BANDAGES/DRESSINGS) ×1
CLOTH BEACON ORANGE TIMEOUT ST (SAFETY) ×5 IMPLANT
CONTAINER PREFILL 10% NBF 60ML (FORM) ×10 IMPLANT
DRAPE HYSTEROSCOPY (DRAPE) ×5 IMPLANT
DRSG COVADERM PLUS 2X2 (GAUZE/BANDAGES/DRESSINGS) ×5 IMPLANT
DRSG OPSITE POSTOP 3X4 (GAUZE/BANDAGES/DRESSINGS) ×5 IMPLANT
ELECT REM PT RETURN 9FT ADLT (ELECTROSURGICAL)
ELECTRODE REM PT RTRN 9FT ADLT (ELECTROSURGICAL) IMPLANT
FORCEPS CUTTING 33CM 5MM (CUTTING FORCEPS) IMPLANT
FORCEPS CUTTING 45CM 5MM (CUTTING FORCEPS) IMPLANT
GLOVE BIOGEL PI IND STRL 8.5 (GLOVE) ×3 IMPLANT
GLOVE BIOGEL PI INDICATOR 8.5 (GLOVE) ×2
GLOVE ECLIPSE 8.0 STRL XLNG CF (GLOVE) ×10 IMPLANT
GOWN STRL REUS W/TWL 2XL LVL3 (GOWN DISPOSABLE) ×5 IMPLANT
GOWN STRL REUS W/TWL LRG LVL3 (GOWN DISPOSABLE) ×10 IMPLANT
LOOP ANGLED CUTTING 22FR (CUTTING LOOP) IMPLANT
NS IRRIG 1000ML POUR BTL (IV SOLUTION) ×5 IMPLANT
PACK LAPAROSCOPY BASIN (CUSTOM PROCEDURE TRAY) ×5 IMPLANT
PACK VAGINAL MINOR WOMEN LF (CUSTOM PROCEDURE TRAY) ×5 IMPLANT
PAD OB MATERNITY 4.3X12.25 (PERSONAL CARE ITEMS) ×5 IMPLANT
POUCH SPECIMEN RETRIEVAL 10MM (ENDOMECHANICALS) ×5 IMPLANT
PROTECTOR NERVE ULNAR (MISCELLANEOUS) ×5 IMPLANT
RINGERS IRRIG 1000ML POUR BTL (IV SOLUTION) IMPLANT
SET IRRIG TUBING LAPAROSCOPIC (IRRIGATION / IRRIGATOR) IMPLANT
SET TUBING HYSTEROSCOPY 2 NDL (TUBING) IMPLANT
SHEARS HARMONIC ACE PLUS 36CM (ENDOMECHANICALS) IMPLANT
STRIP CLOSURE SKIN 1/4X3 (GAUZE/BANDAGES/DRESSINGS) ×4 IMPLANT
SUT MNCRL AB 3-0 PS2 27 (SUTURE) ×5 IMPLANT
SUT VIC AB 2-0 UR6 27 (SUTURE) ×10 IMPLANT
SUT VICRYL 0 ENDOLOOP (SUTURE) IMPLANT
SYR 50ML LL SCALE MARK (SYRINGE) IMPLANT
TOWEL OR 17X24 6PK STRL BLUE (TOWEL DISPOSABLE) ×10 IMPLANT
TRAY FOLEY CATH 14FR (SET/KITS/TRAYS/PACK) ×5 IMPLANT
TROCAR BALLN GELPORT 12X130M (ENDOMECHANICALS) ×5 IMPLANT
TUBE HYSTEROSCOPY W Y-CONNECT (TUBING) IMPLANT
WARMER LAPAROSCOPE (MISCELLANEOUS) ×5 IMPLANT
WATER STERILE IRR 1000ML POUR (IV SOLUTION) ×5 IMPLANT

## 2014-04-17 NOTE — Op Note (Signed)
OPERATIVE NOTE  Daisy [name]  DOB:    1981-03-29  MRN:    [id number]  CSN:    [id number]  Date of Surgery:  04/17/2014  Preoperative Diagnosis:  6 cm left dermoid cyst and the ovary  Obesity  Menorrhagia  Anemia  Postoperative Diagnosis:  Same  Procedure:  Laparoscopic left ovarian cystectomy  Laparoscopic bilateral salpingectomy  Hysteroscopy  Dilation and curettage  NovaSure ablation of the endometrium  Surgeon:  Gildardo Cranker, M.D.  Assistant:  Delsa Bern, M.D.  Anesthetic:  General  Disposition:  Daisy [name] is a 33 y.o. year old female who presents for the above mentioned procedure. She understands the indications for her operation as well as the alternative treatment options. She accepts the risk of, but not limited to, anesthetic complications, bleeding, infections, and possible damage to the surrounding organs. The patient elects bilateral salpingectomy because of the potential reduction of cancer risk.  Findings:  The uterus, fallopian tubes, and right ovary were normal. There was a 6 cm dermoid cyst in the left ovary. The endometrium appeared normal. The bowel and the liver appeared normal.  Procedure:  The patient was taken to the operating room where a general anesthetic was given. The patient's abdomen, perineum, and vagina were prepped with sterile solution. A Foley catheter was placed in the bladder. An examination under anesthesia was performed. A Hulka tenaculum was placed inside the uterus. The patient was sterilely draped. The subumbilical area was injected with half percent Marcaine with epinephrine. A subumbilical incision was made and the incision was extended through the subcutaneous tissue, fascia, and anterior peritoneum. A Hassan cannula was sutured into place. The pelvic organs were inspected with findings as mentioned above. Half percent Marcaine with epinephrine was injected in the left lower quadrant. A 5 mm  trocar was placed and the abdominal cavity under direct visualization. An identical procedure was carried out in the right lower quadrant. The dermoid cyst was elevated intraoperative field. An incision was made in the capsule of the ovary. The cyst was sharply and bluntly dissected from the ovarian stroma. The cyst ruptured during the dissection. The pelvis was vigorously irrigated. A 5 mm laparoscope was placed in the left lower quadrant. An Endo Catch bag was placed through the 10 mm port in the umbilicus. The dermoid cyst was placed in the Endo Catch bag and removed from the patient abdominal cavity. The 10 mm laparoscope was then reinserted. Hemostasis was noted to be adequate. The pelvis was vigorously irrigated removing all of the debris from the dermoid cyst. The right fallopian tube was identified and followed to the fimbriated end. The mesosalpinx of the right fallopian tube was cauterized until it was completely desiccated. The right fallopian tube was excised. Hemostasis was adequate. An identical procedure was carried out on the opposite side. Again hemostasis was adequate. The fallopian tubes were removed through the umbilical port. The pneumoperitoneum was allowed to escape. All instruments were removed. The subumbilical fascia was closed using 0 Vicryl. The skin was reapproximated using 3-0 Monocryl. Sponge and needle counts were correct. The estimated blood loss was less than 20 cc. The patient tolerated her procedure well. The patient was then placed in a more lithotomy position. A Hulka tenaculum was removed from the uterus. The cervix was gently dilated. The hysteroscope was inserted. The cavity was inspected. No pathology was appreciated. The hysteroscope was removed. The cavity was curetted using a medium sharp curet until was felt to be clean. The  NovaSure apparatus was tested. It was functioning properly. The instrument was inserted into the uterus. The cavity width was noted to be 3 cm. The  cavity length was noted to be 6.5 cm. The cavity was ablated. The hysteroscope was reinserted after the ablation and the cavity was inspected. The cavity was felt to be completely ablated. The patient was awakened from her anesthetic without difficulty. She was transported to the recovery room in stable condition. The fallopian tubes, dermoid cyst, and endometrial curettings were sent to pathology. The fluid deficit from hysteroscopy was 10 cc.   Followup instructions:  The patient will return to see Dr. Raphael Gibney in 2 weeks. She was given a copy of the postoperative instructions as prepared by the [facility] for patients who've undergone laparoscopy.  Discharge medications:  Motrin 800 mg tablets            One tablet every 8 hours as needed for moderate pain. Dilaudid 2 mg tablets             One tablets every 4 hours as needed for severe pain. Phenergan 12.5 mg tablets   One tablet every 6 hours as needed for nausea.  Gildardo Cranker, M.D.

## 2014-04-17 NOTE — H&P (Signed)
The patient was interviewed and examined today.  The previously documented history and physical examination was reviewed. There are no changes. The operative procedure was reviewed. The risks and benefits were outlined again. The specific risks include, but are not limited to, anesthetic complications, bleeding, infections, and possible damage to the surrounding organs. The patient's questions were answered.  We are ready to proceed as outlined. The likelihood of the patient achieving the goals of this procedure is very likely.   BP 122/65  Pulse 80  Temp(Src) 98.4 F (36.9 C) (Oral)  Resp 18  Ht 5\' 3"  (1.6 m)  Wt 258 lb (117.028 kg)  BMI 45.71 kg/m2  SpO2 99%  LMP 03/08/2014  Gildardo Cranker, M.D.

## 2014-04-17 NOTE — Anesthesia Preprocedure Evaluation (Addendum)
Anesthesia Evaluation  Patient identified by MRN, date of birth, ID band Patient awake    Reviewed: Allergy & Precautions, H&P , NPO status , Patient's Chart, lab work & pertinent test results, reviewed documented beta blocker date and time   History of Anesthesia Complications (+) PONV and history of anesthetic complications (wakes up crying)  Airway Mallampati: I TM Distance: >3 FB Neck ROM: full    Dental  (+) Edentulous Upper, Upper Dentures, Poor Dentition   Pulmonary neg pulmonary ROS, former smoker,  breath sounds clear to auscultation  Pulmonary exam normal       Cardiovascular Exercise Tolerance: Good negative cardio ROS  Rhythm:regular Rate:Normal     Neuro/Psych  Headaches (migraines - twice a month, frequent headaches), negative psych ROS   GI/Hepatic negative GI ROS, Neg liver ROS,   Endo/Other  Morbid obesity  Renal/GU negative Renal ROS  Female GU complaint (takes dilaudid periodically for ovarian cyst pain, arthritis pain)     Musculoskeletal  (+) Arthritis - (feet.  Takes enbrel weekly for RA.  Will give stress dose steroids), Rheumatoid disorders,    Abdominal   Peds  Hematology negative hematology ROS (+)   Anesthesia Other Findings   Reproductive/Obstetrics negative OB ROS                          Anesthesia Physical Anesthesia Plan  ASA: III  Anesthesia Plan: General ETT   Post-op Pain Management:    Induction:   Airway Management Planned:   Additional Equipment:   Intra-op Plan:   Post-operative Plan:   Informed Consent: I have reviewed the patients History and Physical, chart, labs and discussed the procedure including the risks, benefits and alternatives for the proposed anesthesia with the patient or authorized representative who has indicated his/her understanding and acceptance.   Dental Advisory Given  Plan Discussed with: CRNA and  Surgeon  Anesthesia Plan Comments:         Anesthesia Quick Evaluation

## 2014-04-17 NOTE — Transfer of Care (Signed)
Immediate Anesthesia Transfer of Care Note  Patient: Daisy [name]  Procedure(s) Performed: Procedure(s): LAPAROSCOPIC OVARIAN CYSTECTOMY (Left) DILATATION & CURETTAGE/HYSTEROSCOPY WITH NOVASURE ABLATION (N/A) BILATERAL SALPINGECTOMY (Bilateral)  Patient Location: PACU  Anesthesia Type:General  Level of Consciousness: awake, alert , oriented and patient cooperative  Airway & Oxygen Therapy: Patient Spontanous Breathing and Patient connected to nasal cannula oxygen  Post-op Assessment: Report given to PACU RN and Post -op Vital signs reviewed and stable  Post vital signs: Reviewed and stable  Complications: No apparent anesthesia complications

## 2014-04-17 NOTE — H&P (Signed)
Admission History and Physical Exam for a Gynecology Patient  Daisy [name] is a 33 y.o. female, No obstetric history on file., who presents for laparoscopic resection of a left dermoid cyst. She will also have a D&C with NovaSure ablation of the endometrium. An ultrasound showed a cyst in meter left dermoid cyst. The patient complains of menorrhagia. She has had a tubal sterilization procedure in the past. She has been followed at the [facility] and Gynecology division of [facility].  Past Medical History  Diagnosis Date  . Migraines   . Arthritis 11/2013    avascular arthritis left talon  . Rheumatoid arthritis   . Tightness of left heel cord 11/2013    No prescriptions prior to admission    Past Surgical History  Procedure Laterality Date  . Cesarean section  11/16/2002  . Tubal ligation  07/27/2005    laparoscopic  . Gastrocnemius recession Left 11/21/2013    Procedure: LEFT GASTROC RECESSION;  Surgeon: Wylene Simmer, MD;  Location: [place];  Service: Orthopedics;  Laterality: Left;  . Ankle fusion Left 11/21/2013    Procedure: LEFT TALONAVICULAR ARTHRODESIS;  Surgeon: Wylene Simmer, MD;  Location: [place];  Service: Orthopedics;  Laterality: Left;    Allergies  Allergen Reactions  . Codeine Hives and Nausea And Vomiting    ALSO SWEATING AND DIZZINESS  . Tomato Hives    Family History: family history includes Asthma in her daughter and son; Cancer in her father and mother.  Social History:  reports that she has quit smoking. She has never used smokeless tobacco. She reports that she does not drink alcohol or use illicit drugs.  Review of systems: See HPI.  Admission Physical Exam:    BMI is 45.  Last menstrual period 03/13/2014.  HEENT:                 Within normal limits Chest:                   Clear Heart:                    Regular rate and rhythm Breasts:                No masses, skin changes,  bleeding, or discharge present Abdomen:             Nontender, no masses Extremities:          Grossly normal Neurologic exam: Grossly normal  Pelvic exam:  External genitalia: normal general appearance Vaginal: normal without tenderness, induration or masses and Cystocele Cervix: normal appearance Adnexa: No masses can be appreciated Uterus: Normal size shape and consistency, thickening is noted on the left  CBC    Component Value Date/Time   WBC 13.2* 03/31/2008 2243   RBC 4.39 03/31/2008 2243   HGB 10.6* 03/31/2008 2243   HCT 35.6* 03/31/2008 2243   PLT 549* 03/31/2008 2243   MCV 81.1 03/31/2008 2243   MCHC 29.8* 03/31/2008 2243   RDW 14.2 03/31/2008 2243   LYMPHSABS 2.4 03/31/2008 2243   MONOABS 1.0 03/31/2008 2243   EOSABS 0.1 03/31/2008 2243   BASOSABS 0.0 03/31/2008 2243    Assessment:  Left dermoid cyst  Menorrhagia  Obesity  Anemia  Plan:  The patient will undergo laparoscopy with laparoscopic resection of a left dermoid cyst. She will also have a dilation and curettage with NovaSure ablation of the endometrium. She understands the  indications for surgical procedure. She accepts the [name] of, but not limited to, anesthetic complications, bleeding, infections, and possible damage to the surrounding organs.   Eli Hose 04/17/2014

## 2014-04-17 NOTE — Discharge Instructions (Signed)

## 2014-04-18 ENCOUNTER — Encounter (HOSPITAL_COMMUNITY): Payer: Self-pay | Admitting: Obstetrics and Gynecology

## 2014-05-19 NOTE — Anesthesia Postprocedure Evaluation (Addendum)
  Anesthesia Post-op Note  Patient: Daisy [name]  Procedure(s) Performed: Procedure(s): LAPAROSCOPIC OVARIAN CYSTECTOMY (Left) DILATATION & CURETTAGE/HYSTEROSCOPY WITH NOVASURE ABLATION (N/A) BILATERAL SALPINGECTOMY (Bilateral) Patient is awake and responsive. Pain and nausea are reasonably well controlled. Vital signs are stable and clinically acceptable. Oxygen saturation is clinically acceptable. There are no apparent anesthetic complications at this time. Patient is ready for discharge.

## 2014-09-05 ENCOUNTER — Encounter: Payer: Self-pay | Admitting: Podiatrist

## 2014-09-05 ENCOUNTER — Ambulatory Visit (INDEPENDENT_AMBULATORY_CARE_PROVIDER_SITE_OTHER): Payer: No Typology Code available for payment source | Admitting: Podiatrist

## 2014-09-05 VITALS — BP 130/72 | HR 80 | Resp 16

## 2014-09-05 DIAGNOSIS — L600 Ingrowing nail: Secondary | ICD-10-CM

## 2014-09-05 NOTE — Progress Notes (Signed)
Chief Complaint  Patient presents with  . Toe Pain    1st toe left - lateral border   "I got an ingrown"     HPI: Patient is 34 y.o. female who presents today for pain left hallux nail lateral nail border. She's previously had ingrown toenail procedures performed and she states that the toenail appears infected lateral nail border. She also saw Dr. Doran Durand who performed an ankle fusion arthroscopically and she is very happy with her result. She states she can now walk pain free.   Allergies  Allergen Reactions  . Codeine Hives and Nausea And Vomiting    ALSO SWEATING AND DIZZINESS  . Tomato Hives    Physical Exam  Patient is awake, alert, and oriented x 3.  In no acute distress.  Vascular status is intact with palpable pedal pulses at 2/4 DP and PT bilateral and capillary refill time within normal limits. Neurological sensation is also intact bilaterally via Semmes Weinstein monofilament at 5/5 sites. Light touch, vibratory sensation, Achilles tendon reflex is intact. Dermatological exam reveals skin color, turger and texture as normal. No open lesions present.  Musculature intact with dorsiflexion, plantarflexion, inversion, eversion. Left hallux nail lateral nail border is inflamed and irritated. There is nail growing the skin on the lateral portion of the nail border. No redness, no swelling, no malodor is noted.  Assessment: Ingrowing toenail left first  Plan:Treatment options and alternatives discussed.  Recommended permanent phenol matrixectomy and patient agreed.  left hallux was prepped with alcohol and a 1 to 1 mix of 0.5% marcaine plain and 2% lidocaine plain was administered in a digital block fashion.  The toe was then prepped with betadine solution and exsanguinated.  The offending lateral nail border was then excised and matrix tissue exposed.  Phenol was then applied to the matrix tissue followed by an alcohol wash.  Antibiotic ointment and a dry sterile dressing was applied.  The  patient was dispensed instructions for aftercare.

## 2014-09-05 NOTE — Patient Instructions (Signed)
ANTIBACTERIAL SOAP INSTRUCTIONS  THE DAY AFTER PROCEDURE   For the first dressing change (soak) Place 3-4 drops of antibacterial liquid soap in a quart of warm tap water.  Submerge foot into water for 20 minutes.  If bandage was applied after your procedure, leave on to allow for easy lift off, then remove and continue with soak for the remaining time.  Next, blot area dry with a soft cloth and apply antibiotic ointment such as polysporin, neosporin, or triple antibiotic ointment.  You may then switch to the instructions below    Shower as usual. Before getting out, place a drop of antibacterial liquid soap (Dial) on a wet, clean washcloth.  Gently wipe washcloth over affected area.  Afterward, rinse the area with warm water.  Blot the area dry with a soft cloth and cover with antibiotic ointment (neosporin, polysporin, bacitracin) and band aid or gauze and tape    [facility] Instructions-Post Nail Surgery  You have had your ingrown toenail and root treated with a chemical.  This chemical causes a burn that will drain and ooze like a blister.  1-2 weeks after the procedure you may leave the area open to air at night to help dry it up.  During the day,  It is important to keep this area clean and covered until the toe dries out and forms a scab. Once the scab forms you no longer need to soak or apply a dressing.  If at any time you experience an increase in pain, redness, swelling, or drainage, you should contact the office as soon as possible.    

## 2015-10-23 ENCOUNTER — Other Ambulatory Visit: Payer: Self-pay | Admitting: Obstetrics and Gynecology

## 2015-11-04 ENCOUNTER — Ambulatory Visit: Payer: PRIVATE HEALTH INSURANCE | Admitting: Podiatry

## 2015-11-05 ENCOUNTER — Ambulatory Visit (INDEPENDENT_AMBULATORY_CARE_PROVIDER_SITE_OTHER): Payer: PRIVATE HEALTH INSURANCE | Admitting: Podiatry

## 2015-11-05 ENCOUNTER — Ambulatory Visit (INDEPENDENT_AMBULATORY_CARE_PROVIDER_SITE_OTHER): Payer: PRIVATE HEALTH INSURANCE

## 2015-11-05 ENCOUNTER — Encounter: Payer: Self-pay | Admitting: Podiatry

## 2015-11-05 ENCOUNTER — Ambulatory Visit: Payer: Self-pay

## 2015-11-05 VITALS — BP 116/73 | HR 74 | Resp 16

## 2015-11-05 DIAGNOSIS — M05771 Rheumatoid arthritis with rheumatoid factor of right ankle and foot without organ or systems involvement: Secondary | ICD-10-CM

## 2015-11-05 DIAGNOSIS — M779 Enthesopathy, unspecified: Secondary | ICD-10-CM

## 2015-11-05 NOTE — Progress Notes (Signed)
Subjective:     Patient ID: Daisy [name], female   DOB: 07-13-1981, 35 y.o.   MRN: [id number]  HPI patient presents stating that she had a surgery by Dr. Doran Durand on her left foot and that she needs the same thing done on her right foot but she came here for the procedure. States that she was out of work 3 months with the last surgery   Review of Systems  All other systems reviewed and are negative.      Objective:   Physical Exam  Constitutional: She is oriented to person, place, and time.  Cardiovascular: Intact distal pulses.   Musculoskeletal: Normal range of motion.  Neurological: She is oriented to person, place, and time.  Skin: Skin is warm.  Nursing note and vitals reviewed.  Neurovascular status is found to be intact with patient having good digital perfusion and well oriented 3. sHe has a scar on the dorsum of the left foot with what appears to be a talonavicular fusion. On the right foot there is quite a bit of discomfort within the talonavicular joint and it is painful when palpated and she states that she does have history of rheumatoid arthritis her control.    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 Discussed condition at great length and I do think fusion will be of great benefit to her due to her intense pain and I'm referring her to Dr. Jacqualyn Posey for this procedure  X-ray report indicates that there is a significant narrowing of the talonavicular joint right with 2 screws across the talonavicular joint left with fusion

## 2015-11-27 ENCOUNTER — Ambulatory Visit (INDEPENDENT_AMBULATORY_CARE_PROVIDER_SITE_OTHER): Payer: PRIVATE HEALTH INSURANCE | Admitting: Podiatry

## 2015-11-27 ENCOUNTER — Encounter: Payer: Self-pay | Admitting: Podiatry

## 2015-11-27 VITALS — BP 124/79 | HR 79 | Resp 18

## 2015-11-27 DIAGNOSIS — M1990 Unspecified osteoarthritis, unspecified site: Secondary | ICD-10-CM

## 2015-11-27 DIAGNOSIS — M79671 Pain in right foot: Secondary | ICD-10-CM

## 2015-11-27 DIAGNOSIS — M05771 Rheumatoid arthritis with rheumatoid factor of right ankle and foot without organ or systems involvement: Secondary | ICD-10-CM

## 2015-11-27 NOTE — Patient Instructions (Signed)

## 2015-11-30 ENCOUNTER — Encounter: Payer: Self-pay | Admitting: *Deleted

## 2015-11-30 ENCOUNTER — Telehealth: Payer: Self-pay | Admitting: *Deleted

## 2015-11-30 NOTE — Progress Notes (Signed)
Patient ID: KING TIER, female   DOB: 06-25-1981, 35 y.o.   MRN: [id number]  Subjective: 35 year old female presents the office today for surgical consultation of right foot pain. She states that she has pain on her right foot along the inside part. She states the pain is about the same areas last time when she had surgery in the left foot however she says is more on the inside of her foot this time. She does have it does feel different than the other foot did. She is tried shoe gear changes, orthotics and offloading that any relief of symptoms. At this time she is requesting surgical intervention to help decrease her pain. Denies any systemic complaints such as fevers, chills, nausea, vomiting. No acute changes since last appointment, and no other complaints at this time.   Objective: AAO x3, NAD DP/PT pulses palpable bilaterally, CRT less than 3 seconds Protective sensation intact with Derrel Nip monofilament There is a decrease in medial arch height upon weightbearing. There is tenderness on the talonavicular joint was slightly medial on the right foot. There is no pain on the course of posterior tibial tendon. There is no pain with subtalar joint range of motion and range of motion appears to be intact without any crepitation. There is no pain on the calcaneal cuboid joint. No other areas of tenderness to bilateral lower extremities. Equinus is present on the right.  No areas of pinpoint bony tenderness or pain with vibratory sensation. MMT 5/5, ROM WNL. No edema, erythema, increase in warmth to bilateral lower extremities.  No open lesions or pre-ulcerative lesions.  No pain with calf compression, swelling, warmth, erythema  Assessment: Right foot talonavicular joint arthritis, equinus  Plan: -All treatment options discussed with the patient including all alternatives, risks, complications.  -Reviewed the patient's x-rays and discussed with him the findings. There is arthritic changes  in the talar navicular joint as well as the calcaneal cuboid joint. She has adequate range of motion of subtalar joint. We discussed both conservative and surgical treatment in detail. At this point she looks to proceed with surgery. Discussed with her talonavicular joint arthrodesis as well as gastrocnemius recession. Discussed with her that she'll he has arthritic changes present the calcaneal cuboid joint she may need a triple arthrodesis in the future however we will start with the talonavicular joint arthrodesis. -The incision placement as well as the postoperative course was discussed with the patient. I discussed risks of the surgery which include, but not limited to, infection, bleeding, pain, swelling, need for further surgery, delayed or nonhealing, painful or ugly scar, numbness or sensation changes, over/under correction, recurrence, transfer lesions, further deformity, hardware failure, DVT/PE, loss of toe/foot. Patient understands these risks and wishes to proceed with surgery. The surgical consent was reviewed with the patient all 3 pages were signed. No promises or guarantees were given to the outcome of the procedure. All questions were answered to the best of my ability. Before the surgery the patient was encouraged to call the office if there is any further questions. The surgery will be performed at the [facility] on an outpatient basis. -Since her last surgery she has been placed on methotrexate and Enbrel. Once the clearance from rheumatology prior to surgery in regards to stopping he medications.  -Patient encouraged to call the office with any questions, concerns, change in symptoms.   Celesta Gentile, DPM

## 2015-11-30 NOTE — Telephone Encounter (Signed)
I'm calling to get the name of your Rheumatologist."  It's Dr. Dossie Der with [place]."  Thank you.  I faxed medical letter to Dr. Dossie Der.

## 2015-12-06 ENCOUNTER — Inpatient Hospital Stay (HOSPITAL_COMMUNITY): Payer: PRIVATE HEALTH INSURANCE

## 2015-12-06 ENCOUNTER — Encounter (HOSPITAL_COMMUNITY): Payer: Self-pay | Admitting: *Deleted

## 2015-12-06 ENCOUNTER — Inpatient Hospital Stay (HOSPITAL_COMMUNITY)
Admission: AD | Admit: 2015-12-06 | Discharge: 2015-12-06 | Disposition: A | Discharge status: Home / Self Care | Payer: PRIVATE HEALTH INSURANCE | Source: Ambulatory Visit | Attending: Obstetrics and Gynecology | Admitting: Obstetrics and Gynecology

## 2015-12-06 DIAGNOSIS — G8929 Other chronic pain: Secondary | ICD-10-CM | POA: Insufficient documentation

## 2015-12-06 DIAGNOSIS — Z87891 Personal history of nicotine dependence: Secondary | ICD-10-CM | POA: Insufficient documentation

## 2015-12-06 DIAGNOSIS — R1032 Left lower quadrant pain: Secondary | ICD-10-CM

## 2015-12-06 DIAGNOSIS — M25562 Pain in left knee: Secondary | ICD-10-CM | POA: Insufficient documentation

## 2015-12-06 DIAGNOSIS — M19072 Primary osteoarthritis, left ankle and foot: Secondary | ICD-10-CM | POA: Diagnosis not present

## 2015-12-06 DIAGNOSIS — A599 Trichomoniasis, unspecified: Secondary | ICD-10-CM | POA: Diagnosis not present

## 2015-12-06 DIAGNOSIS — M069 Rheumatoid arthritis, unspecified: Secondary | ICD-10-CM | POA: Insufficient documentation

## 2015-12-06 LAB — WET PREP, GENITAL
CLUE CELLS WET PREP: NONE SEEN
Sperm: NONE SEEN
YEAST WET PREP: NONE SEEN

## 2015-12-06 LAB — URINALYSIS, ROUTINE W REFLEX MICROSCOPIC
Bilirubin Urine: NEGATIVE
Glucose, UA: NEGATIVE mg/dL
Ketones, ur: NEGATIVE mg/dL
Nitrite: NEGATIVE
PH: 5.5 (ref 5.0–8.0)
Protein, ur: NEGATIVE mg/dL
Specific Gravity, Urine: 1.015 (ref 1.005–1.030)

## 2015-12-06 LAB — URINE MICROSCOPIC-ADD ON

## 2015-12-06 MED ORDER — KETOROLAC TROMETHAMINE 30 MG/ML IJ SOLN
30.0000 mg | Freq: Once | INTRAMUSCULAR | Status: AC
  Administered 2015-12-06: 30 mg via INTRAMUSCULAR
  Filled 2015-12-06: qty 1

## 2015-12-06 MED ORDER — ONDANSETRON 4 MG PO TBDP
4.0000 mg | ORAL_TABLET | Freq: Once | ORAL | Status: AC
  Administered 2015-12-06: 4 mg via ORAL
  Filled 2015-12-06: qty 1

## 2015-12-06 MED ORDER — METRONIDAZOLE 500 MG PO TABS
2000.0000 mg | ORAL_TABLET | Freq: Once | ORAL | Status: AC
  Administered 2015-12-06: 2000 mg via ORAL
  Filled 2015-12-06: qty 4

## 2015-12-06 NOTE — MAU Note (Signed)
Having LLQ pain. Had [facility] April 7th due to "hematoma". Spotting some now. Had ablation 93yrs ago. Pain woke me up out of my sleep

## 2015-12-06 NOTE — Progress Notes (Signed)
History  Daisy [name] is a 35 year old non pregnant pt who presents unannounced to MAU w/ c/o ongoing left lower quadrant pain. States had D&C April 7th due to a "hematoma". Reports spotting since procedure. Had ablation 109yrs ago. States pain woke her out of her sleep. Denies vaginal odor.   Patient Active Problem List   Diagnosis Date Noted  . Left lower quadrant pain 12/06/2015  . Arthritis of foot, left 11/21/2013  . KNEE PAIN, LEFT 03/31/2008  . RHEUMATOID ARTHRITIS 03/12/2008  . PAIN IN JOINT, HAND 03/12/2008    Chief Complaint  Patient presents with  . Abdominal Pain   HPI  As above  OB History    Gravida Para Term Preterm AB TAB SAB Ectopic Multiple Living   3 3 3       3       Past Medical History  Diagnosis Date  . Migraines   . Arthritis 11/2013    avascular arthritis left talon  . Rheumatoid arthritis ([place])   . Tightness of left heel cord 11/2013    Past Surgical History  Procedure Laterality Date  . Cesarean section  11/16/2002  . Tubal ligation  07/27/2005    laparoscopic  . Gastrocnemius recession Left 11/21/2013    Procedure: LEFT GASTROC RECESSION;  Surgeon: Wylene Simmer, MD;  Location: [place];  Service: Orthopedics;  Laterality: Left;  . Ankle fusion Left 11/21/2013    Procedure: LEFT TALONAVICULAR ARTHRODESIS;  Surgeon: Wylene Simmer, MD;  Location: [place];  Service: Orthopedics;  Laterality: Left;  . Laparoscopic ovarian cystectomy Left 04/17/2014    Procedure: LAPAROSCOPIC OVARIAN CYSTECTOMY;  Surgeon: Ena Dawley, MD;  Location: [facility];  Service: Gynecology;  Laterality: Left;  . Dilitation & currettage/hystroscopy with novasure ablation N/A 04/17/2014    Procedure: DILATATION & CURETTAGE/HYSTEROSCOPY WITH NOVASURE ABLATION;  Surgeon: Ena Dawley, MD;  Location: [facility];  Service: Gynecology;  Laterality: N/A;  . Bilateral salpingectomy Bilateral 04/17/2014    Procedure: BILATERAL SALPINGECTOMY;  Surgeon: Ena Dawley, MD;  Location: [facility];  Service: Gynecology;  Laterality: Bilateral;    Family History  Problem Relation Age of Onset  . Cancer Mother   . Cancer Father   . Asthma Daughter   . Asthma Son     Social History  Substance Use Topics  . Smoking status: Former Research scientist (life sciences)  . Smokeless tobacco: Never Used     Comment: quit smoking 2010  . Alcohol Use: No    Allergies:  Allergies  Allergen Reactions  . Codeine Hives and Nausea And Vomiting    ALSO SWEATING AND DIZZINESS  . Tomato Hives  . Percocet [Oxycodone-Acetaminophen]     Dizziness and jittery  . Tramadol Hives    Hives, swelling, dizziness, itching    Prescriptions prior to admission  Medication Sig Dispense Refill Last Dose  . etanercept (ENBREL) 50 MG/ML injection Inject into the skin.   Past Week at Unknown time  . ibuprofen (ADVIL,MOTRIN) 800 MG tablet Take 1 tablet (800 mg total) by mouth every 8 (eight) hours as needed. 50 tablet 1 12/05/2015 at Unknown time  . methotrexate (RHEUMATREX) 2.5 MG tablet Take 15 mg by mouth.   Past Week at Unknown time  . naproxen sodium (ANAPROX) 220 MG tablet Take 220 mg by mouth as needed.   Past Month at Unknown time  . valACYclovir (VALTREX) 500 MG tablet Take 1,000 mg by mouth 2 (two) times daily as needed (for outbreaks). PATIENT NEEDS OFFICE  VISIT FOR ADDITIONAL REFILLS   12/05/2015 at Unknown time    ROS  As per HPI Physical Exam   Blood pressure 114/71, pulse 97, temperature 98.2 F (36.8 C), resp. rate 18, height 5' 2.5" (1.588 m), weight 117.482 kg (259 lb).    Physical Exam  Gen: NAD. Abdomen: soft, mild tenderness to left lower quadrant, no rebound or guarding. External genitalia: No external erythema, edema or excoriation. Vagina: No lesions.  Speculum: Dark red blood in vault & bleeding from cervix. +Whiff. Spotting w/ GC/CT collection.  Pelvic: non pregnant uterus, non tender, no CMT. +L adnexal tenderness; no mass palpated. No adnexal tenderness on right;  no mass palpated.  Ext: WNL.   ED Course  Assessment: Chronic left lower quadrant pain  Neg UPT   Plan: UA UPT Wet prep GC/CT Toradol 30 mg IM Pelvic u/s   Farrel Gordon CNM, MS 12/06/2015 6:37 AM   ADDENDUM: Results for orders placed or performed during the hospital encounter of 12/06/15 (from the past 24 hour(s))  Urinalysis, Routine w reflex microscopic (not at [facility])     Status: Abnormal   Collection Time: 12/06/15  5:10 AM  Result Value Ref Range   Color, Urine YELLOW YELLOW   APPearance CLEAR CLEAR   Specific Gravity, Urine 1.015 1.005 - 1.030   pH 5.5 5.0 - 8.0   Glucose, UA NEGATIVE NEGATIVE mg/dL   Hgb urine dipstick MODERATE (A) NEGATIVE   Bilirubin Urine NEGATIVE NEGATIVE   Ketones, ur NEGATIVE NEGATIVE mg/dL   Protein, ur NEGATIVE NEGATIVE mg/dL   Nitrite NEGATIVE NEGATIVE   Leukocytes, UA TRACE (A) NEGATIVE  Urine microscopic-add on     Status: Abnormal   Collection Time: 12/06/15  5:10 AM  Result Value Ref Range   Squamous Epithelial / LPF 0-5 (A) NONE SEEN   WBC, UA 0-5 0 - 5 WBC/hpf   RBC / HPF 0-5 0 - 5 RBC/hpf   Bacteria, UA RARE (A) NONE SEEN   Trichomonas, UA PRESENT   Wet prep, genital     Status: Abnormal   Collection Time: 12/06/15  6:10 AM  Result Value Ref Range   Yeast Wet Prep HPF POC NONE SEEN NONE SEEN   Trich, Wet Prep PRESENT (A) NONE SEEN   Clue Cells Wet Prep HPF POC NONE SEEN NONE SEEN   WBC, Wet Prep HPF POC FEW (A) NONE SEEN   Sperm NONE SEEN    A: Trichomonas  P: Await U/S report Metronidazole 2 g orally in a single dose Report given to R. Marily Lente, CNM   Farrel Gordon, [place] 12/06/15, 7:10 AM

## 2015-12-06 NOTE — Progress Notes (Signed)
Farrel Gordon CNM notified of pt's admission and status. Will see pt

## 2015-12-06 NOTE — Discharge Instructions (Signed)
Trichomoniasis Trichomoniasis is an infection caused by an organism called Trichomonas. The infection can affect both women and men. In women, the outer female genitalia and the vagina are affected. In men, the penis is mainly affected, but the prostate and other reproductive organs can also be involved. Trichomoniasis is a sexually transmitted infection (STI) and is most often passed to another person through sexual contact.  RISK FACTORS  Having unprotected sexual intercourse.  Having sexual intercourse with an infected partner. SIGNS AND SYMPTOMS  Symptoms of trichomoniasis in women include:  Abnormal gray-green frothy vaginal discharge.  Itching and irritation of the vagina.  Itching and irritation of the area outside the vagina. Symptoms of trichomoniasis in men include:   Penile discharge with or without pain.  Pain during urination. This results from inflammation of the urethra. DIAGNOSIS  Trichomoniasis may be found during a Pap test or physical exam. Your health care provider may use one of the following methods to help diagnose this infection:  Testing the pH of the vagina with a test tape.  Using a vaginal swab test that checks for the Trichomonas organism. A test is available that provides results within a few minutes.  Examining a urine sample.  Testing vaginal secretions. Your health care provider may test you for other STIs, including HIV. TREATMENT   You may be given medicine to fight the infection. Women should inform their health care provider if they could be or are pregnant. Some medicines used to treat the infection should not be taken during pregnancy.  Your health care provider may recommend over-the-counter medicines or creams to decrease itching or irritation.  Your sexual partner will need to be treated if infected.  Your health care provider may test you for infection again 3 months after treatment. HOME CARE INSTRUCTIONS   Take medicines only as  directed by your health care provider.  Take over-the-counter medicine for itching or irritation as directed by your health care provider.  Do not have sexual intercourse while you have the infection.  Women should not douche or wear tampons while they have the infection.  Discuss your infection with your partner. Your partner may have gotten the infection from you, or you may have gotten it from your partner.  Have your sex partner get examined and treated if necessary.  Practice safe, informed, and protected sex.  See your health care provider for other STI testing. SEEK MEDICAL CARE IF:   You still have symptoms after you finish your medicine.  You develop abdominal pain.  You have pain when you urinate.  You have bleeding after sexual intercourse.  You develop a rash.  Your medicine makes you sick or makes you throw up (vomit). MAKE SURE YOU:  Understand these instructions.  Will watch your condition.  Will get help right away if you are not doing well or get worse.   This information is not intended to replace advice given to you by your health care provider. Make sure you discuss any questions you have with your health care provider.   Document Released: 12/28/2000 Document Revised: 07/25/2014 Document Reviewed: 04/15/2013 Elsevier Interactive Patient Education 2016 Elsevier Inc.  

## 2015-12-07 LAB — POCT PREGNANCY, URINE: Preg Test, Ur: NEGATIVE

## 2015-12-07 LAB — GC/CHLAMYDIA PROBE AMP (~~LOC~~) NOT AT ARMC
Chlamydia: NEGATIVE
NEISSERIA GONORRHEA: NEGATIVE

## 2015-12-08 NOTE — Telephone Encounter (Signed)
I called and spoke to [facility] at Dr. Audelia Hives office.  I explained that we did not receive clearance for surgery.  We did receive letter that we had sent but there wasn't anything on it.  She stated she would get a message to [place] his nurse and have her re-fax it or give [place] a call.

## 2015-12-10 NOTE — Telephone Encounter (Signed)
I left patient a message that we got clearance from Dr. Dossie Der.  He has recommended you stop the Enbrel two weeks prior to surgery and 2 weeks after surgery.  He said you can continue your other medications.  Please call me and let me know you got this message and understand the instructions.

## 2015-12-11 ENCOUNTER — Telehealth: Payer: Self-pay | Admitting: *Deleted

## 2015-12-11 NOTE — Telephone Encounter (Signed)
"  I'm returning your call from yesterday.  Letting you know that I did receive your message and I do understand the instructions given.  I am not to take my Arthritis medicine 2 weeks prior to surgery and 2 weeks after surgery.  I also brought some paperwork to be filled out for my job on Wednesday.  I don't know if that is something you take care of or someone else.  I don't know the cost to fill those out.  Please give me a call back."  Daisy [name] stated she had already called and left patient a message about her disability forms this morning.

## 2015-12-11 NOTE — Telephone Encounter (Signed)
Pt states she received the pre op instructions concerning her medications and understands.

## 2015-12-15 ENCOUNTER — Telehealth: Payer: Self-pay | Admitting: *Deleted

## 2015-12-15 NOTE — Telephone Encounter (Signed)
"  I'm going to have to cancel my surgery."  Would you like to reschedule?  "No, I have a $4000 deductible and I can't afford it.  Surgical center is asking for half and I don't have it.  I'd like to see if Dr. Jacqualyn Posey can do anything else for me."  Would you like to schedule an appointment?  "Yes, I would.  I'd like to do it June 5, if possible.  That's when I'm off from my job."  He can see you at 9:45 am.  "That will be great."  I called and canceled surgery with Caren Griffins at [facility].

## 2015-12-21 ENCOUNTER — Ambulatory Visit: Payer: Self-pay | Admitting: Podiatry

## 2016-01-01 ENCOUNTER — Encounter: Payer: Self-pay | Admitting: Podiatry

## 2016-07-02 ENCOUNTER — Encounter (HOSPITAL_BASED_OUTPATIENT_CLINIC_OR_DEPARTMENT_OTHER): Payer: Self-pay | Admitting: *Deleted

## 2016-07-02 ENCOUNTER — Emergency Department (HOSPITAL_BASED_OUTPATIENT_CLINIC_OR_DEPARTMENT_OTHER)
Admission: EM | Admit: 2016-07-02 | Discharge: 2016-07-02 | Disposition: A | Discharge status: Home / Self Care | Payer: PRIVATE HEALTH INSURANCE | Attending: Emergency Medicine | Admitting: Emergency Medicine

## 2016-07-02 DIAGNOSIS — J069 Acute upper respiratory infection, unspecified: Secondary | ICD-10-CM | POA: Diagnosis not present

## 2016-07-02 DIAGNOSIS — R05 Cough: Secondary | ICD-10-CM | POA: Diagnosis present

## 2016-07-02 DIAGNOSIS — Z87891 Personal history of nicotine dependence: Secondary | ICD-10-CM | POA: Diagnosis not present

## 2016-07-02 NOTE — ED Triage Notes (Signed)
C/o cough, congestion, runny, bilateral ear pain onset yesterday

## 2016-07-02 NOTE — Discharge Instructions (Signed)
Continue over-the-counter medications as needed for symptomatic relief.  Return to the emergency department if you develop difficulty breathing, or other new and concerning symptoms.

## 2016-07-02 NOTE — ED Provider Notes (Signed)
[place] DEPT [facility] Provider Note   CSN: [id number] Arrival date & time: 07/02/16  0153     History   Chief Complaint Chief Complaint  Patient presents with  . URI    HPI Daisy [name] is a 35 y.o. female.  Patient is a 35 year old female with history of rheumatoid arthritis, migraines. She presents for evaluation of congestion, cough, ear pressure, and runny nose for the past 2 days. She denies any fevers or chills. She denies any chest pain or difficulty breathing. She does report she has 2 children at home who are ill in a similar fashion.   The history is provided by the patient.  URI   This is a new problem. The current episode started 2 days ago. The problem has been gradually worsening. There has been no fever. Pertinent negatives include no chest pain, no diarrhea, no nausea, no vomiting and no sore throat. Treatments tried: Over-the-counter cold medication. The treatment provided no relief.    Past Medical History:  Diagnosis Date  . Arthritis 11/2013   avascular arthritis left talon  . Migraines   . Rheumatoid arthritis ([place])   . Tightness of left heel cord 11/2013    Patient Active Problem List   Diagnosis Date Noted  . Left lower quadrant pain 12/06/2015  . Trichomonas infection 12/06/2015  . Arthritis of foot, left 11/21/2013  . KNEE PAIN, LEFT 03/31/2008  . RHEUMATOID ARTHRITIS 03/12/2008  . PAIN IN JOINT, HAND 03/12/2008    Past Surgical History:  Procedure Laterality Date  . ANKLE FUSION Left 11/21/2013   Procedure: LEFT TALONAVICULAR ARTHRODESIS;  Surgeon: Wylene Simmer, MD;  Location: [place];  Service: Orthopedics;  Laterality: Left;  . BILATERAL SALPINGECTOMY Bilateral 04/17/2014   Procedure: BILATERAL SALPINGECTOMY;  Surgeon: Ena Dawley, MD;  Location: [facility];  Service: Gynecology;  Laterality: Bilateral;  . CESAREAN SECTION  11/16/2002  . DILITATION & CURRETTAGE/HYSTROSCOPY WITH NOVASURE ABLATION N/A 04/17/2014   Procedure:  DILATATION & CURETTAGE/HYSTEROSCOPY WITH NOVASURE ABLATION;  Surgeon: Ena Dawley, MD;  Location: [facility];  Service: Gynecology;  Laterality: N/A;  . GASTROCNEMIUS RECESSION Left 11/21/2013   Procedure: LEFT GASTROC RECESSION;  Surgeon: Wylene Simmer, MD;  Location: [place];  Service: Orthopedics;  Laterality: Left;  . LAPAROSCOPIC OVARIAN CYSTECTOMY Left 04/17/2014   Procedure: LAPAROSCOPIC OVARIAN CYSTECTOMY;  Surgeon: Ena Dawley, MD;  Location: [facility];  Service: Gynecology;  Laterality: Left;  . TUBAL LIGATION  07/27/2005   laparoscopic    OB History    Gravida Para Term Preterm AB Living   3 3 3     3    SAB TAB Ectopic Multiple Live Births           3       Home Medications    Prior to Admission medications   Medication Sig Start Date End Date Taking? Authorizing Provider  etanercept (ENBREL) 50 MG/ML injection Inject 50 mg into the skin once a week.     Historical Provider, MD  ibuprofen (ADVIL,MOTRIN) 800 MG tablet Take 1 tablet (800 mg total) by mouth every 8 (eight) hours as needed. 04/17/14   Ena Dawley, MD  methotrexate (RHEUMATREX) 2.5 MG tablet Take 15 mg by mouth once a week.  04/25/13   Historical Provider, MD  valACYclovir (VALTREX) 500 MG tablet Take 1,000 mg by mouth 2 (two) times daily as needed (for outbreaks). PATIENT NEEDS OFFICE VISIT FOR ADDITIONAL REFILLS 12/27/13   Posey Boyer, MD    Family  History Family History  Problem Relation Age of Onset  . Cancer Mother   . Cancer Father   . Asthma Daughter   . Asthma Son     Social History Social History  Substance Use Topics  . Smoking status: Former Research scientist (life sciences)  . Smokeless tobacco: Never Used     Comment: quit smoking 2010  . Alcohol use No     Allergies   Codeine; Tomato; Percocet [oxycodone-acetaminophen]; and Tramadol   Review of Systems Review of Systems  HENT: Negative for sore throat.   Cardiovascular: Negative for chest pain.  Gastrointestinal: Negative for diarrhea,  nausea and vomiting.  All other systems reviewed and are negative.    Physical Exam Updated Vital Signs BP (!) 157/127 (BP Location: Left Arm)   Pulse 102   Temp 98.1 F (36.7 C) (Oral)   Resp 22   Ht 5\' 2"  (1.575 m)   Wt 263 lb (119.3 kg)   SpO2 100%   BMI 48.10 kg/m   Physical Exam  Constitutional: She is oriented to person, place, and time. She appears well-developed and well-nourished. No distress.  HENT:  Head: Normocephalic and atraumatic.  Mouth/Throat: Oropharynx is clear and moist. No oropharyngeal exudate.  TMs are clear bilaterally.  Neck: Normal range of motion. Neck supple.  Cardiovascular: Normal rate and regular rhythm.  Exam reveals no gallop and no friction rub.   No murmur heard. Pulmonary/Chest: Effort normal and breath sounds normal. No respiratory distress. She has no wheezes.  Abdominal: Soft. Bowel sounds are normal. She exhibits no distension. There is no tenderness.  Musculoskeletal: Normal range of motion.  Lymphadenopathy:    She has no cervical adenopathy.  Neurological: She is alert and oriented to person, place, and time.  Skin: Skin is warm and dry. She is not diaphoretic.  Nursing note and vitals reviewed.    ED Treatments / Results  Labs (all labs ordered are listed, but only abnormal results are displayed) Labs Reviewed - No data to display  EKG  EKG Interpretation None       Radiology No results found.  Procedures Procedures (including critical care time)  Medications Ordered in ED Medications - No data to display   Initial Impression / Assessment and Plan / ED Course  I have reviewed the triage vital signs and the nursing notes.  Pertinent labs & imaging results that were available during my care of the patient were reviewed by me and considered in my medical decision making (see chart for details).  Clinical Course     Symptoms most likely viral in nature. Her lungs are clear and vital signs are stable. Her blood  pressure is somewhat elevated, however I believe related to her not feeling well. She was advised to keep an eye on this at home and is to return as needed for any problems. I've advised her to continue over-the-counter medications as needed for symptomatic relief. I see no indication for antibiotics or further testing.  Final Clinical Impressions(s) / ED Diagnoses   Final diagnoses:  None    New Prescriptions New Prescriptions   No medications on file     Veryl Speak, MD 07/02/16 [phone]

## 2016-09-07 ENCOUNTER — Other Ambulatory Visit: Payer: Self-pay | Admitting: Obstetrics and Gynecology

## 2016-11-18 ENCOUNTER — Other Ambulatory Visit: Payer: Self-pay | Admitting: Obstetrics and Gynecology

## 2016-12-11 ENCOUNTER — Encounter (HOSPITAL_COMMUNITY): Payer: Self-pay | Admitting: *Deleted

## 2016-12-11 ENCOUNTER — Inpatient Hospital Stay (HOSPITAL_COMMUNITY)
Admission: AD | Admit: 2016-12-11 | Discharge: 2016-12-11 | Disposition: A | Discharge status: Home / Self Care | Payer: PRIVATE HEALTH INSURANCE | Source: Ambulatory Visit | Attending: Obstetrics and Gynecology | Admitting: Obstetrics and Gynecology

## 2016-12-11 DIAGNOSIS — Z79899 Other long term (current) drug therapy: Secondary | ICD-10-CM | POA: Insufficient documentation

## 2016-12-11 DIAGNOSIS — Z87891 Personal history of nicotine dependence: Secondary | ICD-10-CM | POA: Insufficient documentation

## 2016-12-11 DIAGNOSIS — G8929 Other chronic pain: Secondary | ICD-10-CM | POA: Insufficient documentation

## 2016-12-11 DIAGNOSIS — R109 Unspecified abdominal pain: Secondary | ICD-10-CM | POA: Diagnosis present

## 2016-12-11 DIAGNOSIS — Z885 Allergy status to narcotic agent status: Secondary | ICD-10-CM | POA: Diagnosis not present

## 2016-12-11 DIAGNOSIS — Z9889 Other specified postprocedural states: Secondary | ICD-10-CM | POA: Diagnosis not present

## 2016-12-11 LAB — URINALYSIS, ROUTINE W REFLEX MICROSCOPIC
BILIRUBIN URINE: NEGATIVE
Glucose, UA: NEGATIVE mg/dL
Ketones, ur: NEGATIVE mg/dL
NITRITE: NEGATIVE
PROTEIN: NEGATIVE mg/dL
Specific Gravity, Urine: 1.01 (ref 1.005–1.030)
pH: 6 (ref 5.0–8.0)

## 2016-12-11 LAB — WET PREP, GENITAL
CLUE CELLS WET PREP: NONE SEEN
Sperm: NONE SEEN
Yeast Wet Prep HPF POC: NONE SEEN

## 2016-12-11 LAB — POCT PREGNANCY, URINE: PREG TEST UR: NEGATIVE

## 2016-12-11 MED ORDER — MEPERIDINE HCL 50 MG PO TABS
50.0000 mg | ORAL_TABLET | Freq: Once | ORAL | Status: AC
  Administered 2016-12-11: 50 mg via ORAL

## 2016-12-11 MED ORDER — METRONIDAZOLE 500 MG PO TABS
2000.0000 mg | ORAL_TABLET | Freq: Once | ORAL | Status: AC
  Administered 2016-12-11: 2000 mg via ORAL
  Filled 2016-12-11: qty 4

## 2016-12-11 MED ORDER — METRONIDAZOLE 500 MG PO TABS: 500.0000 mg | ORAL_TABLET | Freq: Once | ORAL | Status: DC

## 2016-12-11 NOTE — Discharge Instructions (Signed)
Trichomonas Test Why am I having this test? The trichomonas test is done to diagnose trichomoniasis, an infection caused by an organism called Trichomonas. Trichomoniasis is a sexually transmitted infection (STI). In women, it causes vaginal infections. In men, it can cause the tube that carries urine (urethra) to become inflamed (urethritis). You may have this test as a part of a routine screening for STIs or if you have symptoms of trichomoniasis. To perform the test, your health care provider will take a sample of discharge. The sample is taken from the vagina or cervix in women and from the urethra in men. A urine sample can also be used for testing. What do the results mean? It is your responsibility to obtain your test results. Ask the lab or department performing the test when and how you will get your results. Contact your health care provider to discuss any questions you have about your results. Negative test results  A negative test means you do not have trichomoniasis. Follow your health care provider's directions about any follow-up testing. Positive test results  A positive test result means you have an active infection that needs to be treated with antibiotic medicine. All your current sexual partners must also be treated or it is likely you will get reinfected. If your test is positive, your health care provider will start you on medicine and may advise you to:  Not have sexual intercourse until your infection has cleared up.  Use a latex condom properly every time you have sexual intercourse.  Limit the number of sexual partners you have. The more partners you have, the greater your risk of contracting trichomoniasis or another STI.  Tell all sexual partners about your infection so they can also be treated and to prevent reinfection. Talk with your health care provider to discuss your results, treatment options, and if necessary, the need for more tests. Talk with your health care  provider if you have any questions about your results. This information is not intended to replace advice given to you by your health care provider. Make sure you discuss any questions you have with your health care provider. Document Released: 08/06/2004 Document Revised: 02/03/2016 Document Reviewed: 07/16/2013 Elsevier Interactive Patient Education  2017 Else[employer]. Abdominal Pain, Adult Abdominal pain can be caused by many things. Often, abdominal pain is not serious and it gets better with no treatment or by being treated at home. However, sometimes abdominal pain is serious. Your health care provider will do a medical history and a physical exam to try to determine the cause of your abdominal pain. Follow these instructions at home:  Take over-the-counter and prescription medicines only as told by your health care provider. Do not take a laxative unless told by your health care provider.  Drink enough fluid to keep your urine clear or pale yellow.  Watch your condition for any changes.  Keep all follow-up visits as told by your health care provider. This is important. Contact a health care provider if:  Your abdominal pain changes or gets worse.  You are not hungry or you lose weight without trying.  You are constipated or have diarrhea for more than 2-3 days.  You have pain when you urinate or have a bowel movement.  Your abdominal pain wakes you up at night.  Your pain gets worse with meals, after eating, or with certain foods.  You are throwing up and cannot keep anything down.  You have a fever. Get help right away if:  Your pain does not go away as soon as your health care provider told you to expect.  You cannot stop throwing up.  Your pain is only in areas of the abdomen, such as the right side or the left lower portion of the abdomen.  You have bloody or black stools, or stools that look like tar.  You have severe pain, cramping, or bloating in your  abdomen.  You have signs of dehydration, such as:  Dark urine, very little urine, or no urine.  Cracked lips.  Dry mouth.  Sunken eyes.  Sleepiness.  Weakness. This information is not intended to replace advice given to you by your health care provider. Make sure you discuss any questions you have with your health care provider. Document Released: 04/13/2005 Document Revised: 01/22/2016 Document Reviewed: 12/16/2015 Elsevier Interactive Patient Education  2017 [employer].

## 2016-12-11 NOTE — MAU Provider Note (Signed)
History    Daisy [name] is a 36y.o. [id number] who presents, unannounced, for abdominal pain.  Patient reports pain is intermittent and describes pain as cramping similar to contraction pain.  Patient states she took ibuprofen on Friday with good results.  Patient states she has an ablation on 2015 and has been having pain occurring since one year post ablation.  Patient also reports she has frequent urination with pain onset, but no other symptoms.  Patient reports that she took recently completed antibiotic course for UTI.  Patient denies vaginal bleeding and reports last date of menses on Friday 12/08/16.  Patient reports 2nd [facility] on Nov 18, 2016 by Dr. AVS with no relief of pain.  Patient states that she was told by Dr. Mahalia Longest to follow up with PCP which is scheduled for Tuesday.   Patient Active Problem List   Diagnosis Date Noted  . Left lower quadrant pain 12/06/2015  . Trichomonas infection 12/06/2015  . Arthritis of foot, left 11/21/2013  . KNEE PAIN, LEFT 03/31/2008  . RHEUMATOID ARTHRITIS 03/12/2008  . PAIN IN JOINT, HAND 03/12/2008    Chief Complaint  Patient presents with  . Abdominal Pain   HPI  OB History    Gravida Para Term Preterm AB Living   3 3 3     3    SAB TAB Ectopic Multiple Live Births           3      Past Medical History:  Diagnosis Date  . Arthritis 11/2013   avascular arthritis left talon  . Migraines   . Rheumatoid arthritis ([place])   . Tightness of left heel cord 11/2013    Past Surgical History:  Procedure Laterality Date  . ANKLE FUSION Left 11/21/2013   Procedure: LEFT TALONAVICULAR ARTHRODESIS;  Surgeon: Wylene Simmer, MD;  Location: [place];  Service: Orthopedics;  Laterality: Left;  . BILATERAL SALPINGECTOMY Bilateral 04/17/2014   Procedure: BILATERAL SALPINGECTOMY;  Surgeon: Ena Dawley, MD;  Location: [facility];  Service: Gynecology;  Laterality: Bilateral;  . CESAREAN SECTION  11/16/2002  . DILATION AND CURETTAGE OF UTERUS    .  DILITATION & CURRETTAGE/HYSTROSCOPY WITH NOVASURE ABLATION N/A 04/17/2014   Procedure: DILATATION & CURETTAGE/HYSTEROSCOPY WITH NOVASURE ABLATION;  Surgeon: Ena Dawley, MD;  Location: [facility];  Service: Gynecology;  Laterality: N/A;  . GASTROCNEMIUS RECESSION Left 11/21/2013   Procedure: LEFT GASTROC RECESSION;  Surgeon: Wylene Simmer, MD;  Location: [place];  Service: Orthopedics;  Laterality: Left;  . LAPAROSCOPIC OVARIAN CYSTECTOMY Left 04/17/2014   Procedure: LAPAROSCOPIC OVARIAN CYSTECTOMY;  Surgeon: Ena Dawley, MD;  Location: [facility];  Service: Gynecology;  Laterality: Left;  . TUBAL LIGATION  07/27/2005   laparoscopic    Family History  Problem Relation Age of Onset  . Cancer Mother   . Cancer Father   . Asthma Daughter   . Asthma Son     Social History  Substance Use Topics  . Smoking status: Former Research scientist (life sciences)  . Smokeless tobacco: Never Used     Comment: quit smoking 2010  . Alcohol use No    Allergies:  Allergies  Allergen Reactions  . Codeine Hives and Nausea And Vomiting    ALSO SWEATING AND DIZZINESS  . Tomato Hives  . Percocet [Oxycodone-Acetaminophen]     Dizziness and jittery  . Tramadol Hives    Hives, swelling, dizziness, itching    Prescriptions Prior to Admission  Medication Sig Dispense Refill Last Dose  . etanercept (ENBREL) 50  MG/ML injection Inject 50 mg into the skin once a week.    Past Week at Unknown time  . ibuprofen (ADVIL,MOTRIN) 800 MG tablet Take 1 tablet (800 mg total) by mouth every 8 (eight) hours as needed. 50 tablet 1 Past Week at Unknown time  . methotrexate (RHEUMATREX) 2.5 MG tablet Take 15 mg by mouth once a week.    Past Week at Unknown time  . valACYclovir (VALTREX) 500 MG tablet Take 1,000 mg by mouth 2 (two) times daily as needed (for outbreaks). PATIENT NEEDS OFFICE VISIT FOR ADDITIONAL REFILLS   More than a month at Unknown time    Review of Systems  Constitutional: Negative for chills and fever.   Gastrointestinal: Positive for abdominal pain. Negative for constipation, diarrhea, nausea and vomiting.  Genitourinary: Positive for frequency. Negative for dysuria, hematuria and urgency.    See HPI Above Physical Exam   Blood pressure 120/71, pulse 97, temperature 98.3 F (36.8 C), resp. rate 18, height 5\' 3"  (1.6 m), weight 108.4 kg (239 lb).  Results for orders placed or performed during the hospital encounter of 12/11/16 (from the past 24 hour(s))  Urinalysis, Routine w reflex microscopic     Status: Abnormal   Collection Time: 12/11/16  5:42 AM  Result Value Ref Range   Color, Urine STRAW (A) YELLOW   APPearance CLEAR CLEAR   Specific Gravity, Urine 1.010 1.005 - 1.030   pH 6.0 5.0 - 8.0   Glucose, UA NEGATIVE NEGATIVE mg/dL   Hgb urine dipstick SMALL (A) NEGATIVE   Bilirubin Urine NEGATIVE NEGATIVE   Ketones, ur NEGATIVE NEGATIVE mg/dL   Protein, ur NEGATIVE NEGATIVE mg/dL   Nitrite NEGATIVE NEGATIVE   Leukocytes, UA SMALL (A) NEGATIVE   RBC / HPF 0-5 0 - 5 RBC/hpf   WBC, UA 0-5 0 - 5 WBC/hpf   Bacteria, UA RARE (A) NONE SEEN   Squamous Epithelial / LPF 0-5 (A) NONE SEEN   Mucous PRESENT   Pregnancy, urine POC     Status: None   Collection Time: 12/11/16  6:12 AM  Result Value Ref Range   Preg Test, Ur NEGATIVE NEGATIVE    Physical Exam  Constitutional: She is oriented to person, place, and time. She appears well-developed and well-nourished. No distress.  HENT:  Head: Normocephalic and atraumatic.  Eyes: Conjunctivae are normal.  Cardiovascular: Normal rate, regular rhythm and normal heart sounds.   Respiratory: Effort normal and breath sounds normal.  GI: Soft. Bowel sounds are normal.  Genitourinary: Uterus is tender. Uterus is not enlarged. Cervix exhibits no motion tenderness. No tenderness in the vagina. Vaginal discharge found.  Genitourinary Comments: Sterile Speculum Exam: -Vaginal Vault: Small amt thin pinkish white discharge -wet prep  collected -Cervix:Appears closed, No discharge from os.  Nabothian cysts at 5 o'clock.  Several small ulerative type cysts-nontender-GC/CT declined by patient -Bimanual Exam: Unable to palpate adnexa  Musculoskeletal: Normal range of motion.  Neurological: She is oriented to person, place, and time.  Skin: Skin is warm and dry.  Psychiatric: She has a normal mood and affect. Her behavior is normal.    ED Course  Assessment: Chronic Abdominal Pain Post Op-D&C  Plan: -Labs: UA, Wet prep -Demerol for pain -PE as above  Follow Up (0700) -UA: Negative  -Wet prep Pending -Will discharge to home  -Instructed to take ibuprofen, starting at 11am and then every 8 hours until pain properly managed -Encouraged to try heating pads, warm baths, and position changes to promote relief -Keep  appt as scheduled for Tuesday May 29th to see PCP -Encouraged to call if any questions or concerns arise prior to next scheduled office visit.  -Discharged to home in stable condition  [place], MSN 12/11/2016 6:27 AM  Addendum (5/27 1905) Message left on this provider's call phone at 0730 to discuss patient wet prep results Review of chart reveals wet prep positive for Trich. NP,CNM confirms that patient was treated with 2gram flagyl while at hospital, but had incident of emesis Rx sent to pharmacy on file  Maryann Conners MSN, CNM 12/11/2016 7:25 PM

## 2016-12-11 NOTE — Progress Notes (Signed)
Gavin Pound CNM notified of pt's admission and status as documented. Will see pt

## 2016-12-11 NOTE — MAU Note (Signed)
Having abd pain for about a yr. Had ablation in 2015. Last July had u/s and had "some fld build up" and ? Cyst on ovaries. Had [facility] May 4th 2018 for "more fld build up". Hx endometriosis. Pain awoke me out of my sleep this am. When I have pain like this I urinate a lot. Pain in lower abd and radiates to back

## 2017-01-20 ENCOUNTER — Emergency Department (HOSPITAL_BASED_OUTPATIENT_CLINIC_OR_DEPARTMENT_OTHER)
Admission: EM | Admit: 2017-01-20 | Discharge: 2017-01-20 | Disposition: A | Discharge status: Home / Self Care | Payer: PRIVATE HEALTH INSURANCE | Attending: Emergency Medicine | Admitting: Emergency Medicine

## 2017-01-20 ENCOUNTER — Encounter (HOSPITAL_BASED_OUTPATIENT_CLINIC_OR_DEPARTMENT_OTHER): Payer: Self-pay | Admitting: Emergency Medicine

## 2017-01-20 DIAGNOSIS — Y9389 Activity, other specified: Secondary | ICD-10-CM | POA: Diagnosis not present

## 2017-01-20 DIAGNOSIS — Z87891 Personal history of nicotine dependence: Secondary | ICD-10-CM | POA: Diagnosis not present

## 2017-01-20 DIAGNOSIS — Y999 Unspecified external cause status: Secondary | ICD-10-CM | POA: Diagnosis not present

## 2017-01-20 DIAGNOSIS — Y92481 Parking lot as the place of occurrence of the external cause: Secondary | ICD-10-CM | POA: Diagnosis not present

## 2017-01-20 DIAGNOSIS — M545 Low back pain: Secondary | ICD-10-CM | POA: Insufficient documentation

## 2017-01-20 MED ORDER — KETOROLAC TROMETHAMINE 60 MG/2ML IM SOLN
30.0000 mg | Freq: Once | INTRAMUSCULAR | Status: AC
  Administered 2017-01-20: 30 mg via INTRAMUSCULAR
  Filled 2017-01-20: qty 2
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
## 2017-09-19 ENCOUNTER — Other Ambulatory Visit: Payer: Self-pay | Admitting: Obstetrics and Gynecology

## 2017-09-19 DIAGNOSIS — R102 Pelvic and perineal pain: Secondary | ICD-10-CM | POA: Insufficient documentation

## 2017-09-21 ENCOUNTER — Encounter (HOSPITAL_BASED_OUTPATIENT_CLINIC_OR_DEPARTMENT_OTHER): Payer: Self-pay | Admitting: *Deleted

## 2017-09-21 ENCOUNTER — Other Ambulatory Visit: Payer: Self-pay
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
## 2017-09-22 NOTE — Progress Notes (Signed)
Received cbc result dated 09-13-2017  via fax from pt  and placed in chart.

## 2017-10-06 ENCOUNTER — Ambulatory Visit (HOSPITAL_BASED_OUTPATIENT_CLINIC_OR_DEPARTMENT_OTHER)
Admission: RE | Admit: 2017-10-06 | Payer: PRIVATE HEALTH INSURANCE | Source: Ambulatory Visit | Admitting: Obstetrics and Gynecology

## 2017-10-06 HISTORY — DX: Other specified postprocedural states: R11.2

## 2017-10-06 HISTORY — DX: Other specified postprocedural states: Z98.890

## 2017-10-06 HISTORY — DX: Anemia, unspecified: D64.9

## 2017-10-06 HISTORY — DX: Anxiety disorder, unspecified: F41.9

## 2017-10-06 HISTORY — DX: Pelvic and perineal pain: R10.2

## 2017-10-06 SURGERY — LAPAROSCOPY OPERATIVE
Anesthesia: General

## 2018-01-26 ENCOUNTER — Emergency Department (HOSPITAL_BASED_OUTPATIENT_CLINIC_OR_DEPARTMENT_OTHER): Payer: PRIVATE HEALTH INSURANCE

## 2018-01-26 ENCOUNTER — Emergency Department (HOSPITAL_BASED_OUTPATIENT_CLINIC_OR_DEPARTMENT_OTHER)
Admission: EM | Admit: 2018-01-26 | Discharge: 2018-01-26 | Disposition: A | Discharge status: Home / Self Care | Payer: PRIVATE HEALTH INSURANCE | Attending: Emergency Medicine | Admitting: Emergency Medicine

## 2018-01-26 ENCOUNTER — Other Ambulatory Visit: Payer: Self-pay

## 2018-01-26 ENCOUNTER — Encounter (HOSPITAL_BASED_OUTPATIENT_CLINIC_OR_DEPARTMENT_OTHER): Payer: Self-pay | Admitting: *Deleted

## 2018-01-26 DIAGNOSIS — M79672 Pain in left foot: Secondary | ICD-10-CM | POA: Insufficient documentation

## 2018-01-26 DIAGNOSIS — Z79899 Other long term (current) drug therapy: Secondary | ICD-10-CM | POA: Insufficient documentation

## 2018-01-26 DIAGNOSIS — Z87891 Personal history of nicotine dependence: Secondary | ICD-10-CM | POA: Insufficient documentation

## 2018-01-26 MED ORDER — KETOROLAC TROMETHAMINE 60 MG/2ML IM SOLN
60.0000 mg | Freq: Once | INTRAMUSCULAR | Status: AC
  Administered 2018-01-26: 60 mg via INTRAMUSCULAR
  Filled 2018-01-26: qty 2

## 2018-01-26 NOTE — ED Triage Notes (Signed)
Left foot pain x 3 weeks. Hx of RA. Denies injury.

## 2018-01-26 NOTE — ED Provider Notes (Signed)
[facility] EMERGENCY DEPARTMENT Provider Note   CSN: [id number] Arrival date & time: 01/26/18  2112     History   Chief Complaint Chief Complaint  Patient presents with  . Foot Pain    HPI Daisy [name] is a 37 y.o. female.  HPI Patient has history of rheumatoid arthritis.  States that 3 weeks ago she developed left foot pain which resolved with taking 600 mg of ibuprofen.  Developed recurrence of the left foot pain yesterday.  No known trauma.  States the pain is over the dorsum of the foot and over the plantar surface.  She has had some mild swelling.  No redness or warmth.  No weakness or numbness. Past Medical History:  Diagnosis Date  . Anemia   . Anxiety   . Arthritis 11/2013   avascular arthritis left talon oa  . Migraines   . Pelvic pain   . PONV (postoperative nausea and vomiting)   . Rheumatoid arthritis ([place])   . Tightness of left heel cord 11/2013    Patient Active Problem List   Diagnosis Date Noted  . Pelvic pain 09/19/2017  . Left lower quadrant pain 12/06/2015  . Trichomonas infection 12/06/2015  . Arthritis of foot, left 11/21/2013  . KNEE PAIN, LEFT 03/31/2008  . RHEUMATOID ARTHRITIS 03/12/2008  . PAIN IN JOINT, HAND 03/12/2008    Past Surgical History:  Procedure Laterality Date  . ANKLE FUSION Left 11/21/2013   Procedure: LEFT TALONAVICULAR ARTHRODESIS;  Surgeon: Wylene Simmer, MD;  Location: [facility];  Service: Orthopedics;  Laterality: Left;  . BILATERAL SALPINGECTOMY Bilateral 04/17/2014   Procedure: BILATERAL SALPINGECTOMY;  Surgeon: Ena Dawley, MD;  Location: [facility];  Service: Gynecology;  Laterality: Bilateral;  . CESAREAN SECTION  11/16/2002  . DILATION AND CURETTAGE OF UTERUS    . DILITATION & CURRETTAGE/HYSTROSCOPY WITH NOVASURE ABLATION N/A 04/17/2014   Procedure: DILATATION & CURETTAGE/HYSTEROSCOPY WITH NOVASURE ABLATION;  Surgeon: Ena Dawley, MD;  Location: [facility];  Service: Gynecology;  Laterality: N/A;   . GASTROCNEMIUS RECESSION Left 11/21/2013   Procedure: LEFT GASTROC RECESSION;  Surgeon: Wylene Simmer, MD;  Location: [facility];  Service: Orthopedics;  Laterality: Left;  . LAPAROSCOPIC OVARIAN CYSTECTOMY Left 04/17/2014   Procedure: LAPAROSCOPIC OVARIAN CYSTECTOMY;  Surgeon: Ena Dawley, MD;  Location: [facility];  Service: Gynecology;  Laterality: Left;  . TUBAL LIGATION  07/27/2005   laparoscopic     OB History    Gravida  3   Para  3   Term  3   Preterm      AB      Living  3     SAB      TAB      Ectopic      Multiple      Live Births  3            Home Medications    Prior to Admission medications   Medication Sig Start Date End Date Taking? Authorizing Provider  etanercept (ENBREL) 50 MG/ML injection Inject 50 mg into the skin once a week. monday    [provider]  folic acid (FOLVITE) 1 MG tablet Take 1 mg by mouth daily.    [provider]  ibuprofen (ADVIL,MOTRIN) 800 MG tablet Take 1 tablet (800 mg total) by mouth every 8 (eight) hours as needed. 04/17/14   Ena Dawley, MD  methotrexate (RHEUMATREX) 2.5 MG tablet Take 2.5 mg by mouth once a week. 6 tabs weekly on  monday 04/25/13   [provider]  phentermine 37.5 MG capsule Take 37.5 mg by mouth every morning. Daily at 500 am    [provider]  topiramate (TOPAMAX) 25 MG tablet Take 25 mg by mouth daily.    [provider]  valACYclovir (VALTREX) 500 MG tablet Take 1,000 mg by mouth 2 (two) times daily as needed (for outbreaks). PATIENT NEEDS OFFICE VISIT FOR ADDITIONAL REFILLS 12/27/13   Posey Boyer, MD    Family History Family History  Problem Relation Age of Onset  . Cancer Mother   . Cancer Father   . Asthma Daughter   . Asthma Son     Social History Social History   Tobacco Use  . Smoking status: Former Smoker    Packs/day: 1.00    Years: 4.00    Pack years: 4.00    Types: Cigarettes  . Smokeless tobacco: Never Used   . Tobacco comment: quit smoking 2010  Substance Use Topics  . Alcohol use: Yes    Comment: social  . Drug use: No     Allergies   Codeine; Tomato; Percocet [oxycodone-acetaminophen]; and Tramadol   Review of Systems Review of Systems  Constitutional: Negative for chills and fever.  Cardiovascular: Negative for chest pain and leg swelling.  Musculoskeletal: Positive for arthralgias. Negative for back pain and myalgias.  Skin: Negative for rash and wound.  Neurological: Negative for weakness and numbness.  All other systems reviewed and are negative.    Physical Exam Updated Vital Signs BP 128/77 (BP Location: Left Arm)   Pulse 79   Temp 97.6 F (36.4 C) (Oral)   Resp 18   Ht 5\' 3"  (1.6 m)   Wt 98.4 kg (217 lb)   SpO2 100%   BMI 38.44 kg/m   Physical Exam  Constitutional: She is oriented to person, place, and time. She appears well-developed and well-nourished. No distress.  HENT:  Head: Normocephalic and atraumatic.  Neck: Normal range of motion. Neck supple.  Cardiovascular: Normal rate.  Pulmonary/Chest: Effort normal.  Abdominal: Soft.  Musculoskeletal: Normal range of motion. She exhibits tenderness. She exhibits no edema.  Patient has tenderness to palpation over the dorsum of the left foot especially over the lateral surface.  She has minimal tenderness to palpation over the plantar surface just anterior to the calcaneus.  Mild swelling.  No erythema or warmth.  Posterior tibial and dorsalis pedis pulses are 2+.  Neurological: She is alert and oriented to person, place, and time.  5/5 motor in all extremities.  Sensation fully intact.  Skin: Skin is warm and dry. Capillary refill takes less than 2 seconds. No rash noted. She is not diaphoretic. No erythema.  Psychiatric: She has a normal mood and affect. Her behavior is normal.  Nursing note and vitals reviewed.    ED Treatments / Results  Labs (all labs ordered are listed, but only abnormal results are  displayed) Labs Reviewed - No data to display  EKG None  Radiology Dg Foot Complete Left  Result Date: 01/26/2018 CLINICAL DATA:  Intermittent left foot pain and swelling today. History of foot surgery and rheumatoid arthritis. EXAM: LEFT FOOT - COMPLETE 3+ VIEW COMPARISON:  Radiographs 03/20/2014. FINDINGS: Stable postsurgical changes from screw arthrodesis of the talonavicular joint. The arthrodesis appears solid. There is no hardware loosening. No evidence of acute fracture or dislocation. Midfoot degenerative changes have mildly progressed. The additional joint spaces are preserved. No focal soft tissue abnormalities are seen. IMPRESSION: No acute  osseous findings post talonavicular fusion. Mildly progressive midfoot degenerative changes. Electronically Signed   By: Richardean Sale M.D.   On: 01/26/2018 22:26    Procedures Procedures (including critical care time)  Medications Ordered in ED Medications  ketorolac (TORADOL) injection 60 mg (has no administration in time range)     Initial Impression / Assessment and Plan / ED Course  I have reviewed the triage vital signs and the nursing notes.  Pertinent labs & imaging results that were available during my care of the patient were reviewed by me and considered in my medical decision making (see chart for details).    Arthritic changes.  No evidence of fracture.  Continue NSAIDs and follow-up with podiatrist if symptoms persist.  Return precautions given.   Final Clinical Impressions(s) / ED Diagnoses   Final diagnoses:  Foot pain, left    ED Discharge Orders    None       Julianne Rice, MD 01/26/18 2234

## 2020-11-24 LAB — HM PAP SMEAR: HM Pap smear: NORMAL

## 2021-03-24 ENCOUNTER — Other Ambulatory Visit (HOSPITAL_COMMUNITY): Payer: Self-pay

## 2021-03-24 ENCOUNTER — Telehealth: Payer: Self-pay | Admitting: Pharmacist

## 2021-03-24 MED ORDER — ETANERCEPT 50 MG/ML ~~LOC~~ SOAJ
SUBCUTANEOUS | 3 refills | Status: DC
  Filled 2021-03-24: qty 4, 28d supply, fill #0

## 2021-03-24 NOTE — Telephone Encounter (Signed)
Called patient to schedule an appointment for the [facility] [facility]. I was unable to reach the patient so I left a HIPAA-compliant message requesting that the patient return my call.   Luke Van Ausdall, PharmD, BCACP, CPP Clinical Pharmacist [facility] [phone]  

## 2021-03-25 ENCOUNTER — Other Ambulatory Visit (HOSPITAL_COMMUNITY): Payer: Self-pay

## 2021-03-25 ENCOUNTER — Other Ambulatory Visit: Payer: Self-pay | Admitting: Pharmacist

## 2021-03-25 ENCOUNTER — Ambulatory Visit: Payer: No Typology Code available for payment source | Attending: Family Medicine | Admitting: Pharmacist

## 2021-03-25 ENCOUNTER — Other Ambulatory Visit: Payer: Self-pay

## 2021-03-25 DIAGNOSIS — Z79899 Other long term (current) drug therapy: Secondary | ICD-10-CM

## 2021-03-25 MED ORDER — ETANERCEPT 50 MG/ML ~~LOC~~ SOAJ
SUBCUTANEOUS | 3 refills | Status: DC
  Filled 2021-03-25: qty 4, fill #0
  Filled 2021-03-25 – 2021-04-01 (×2): qty 4, 28d supply, fill #0
  Filled 2021-04-23: qty 4, 28d supply, fill #1
  Filled 2021-05-21: qty 4, 28d supply, fill #2
  Filled 2021-06-23: qty 4, 28d supply, fill #3

## 2021-03-25 NOTE — Progress Notes (Signed)
   S: Patient presents for review of their specialty medication therapy.  Patient is currently taking Enbrel for RA. Patient is managed by Dr. Simona Huh Ang for this.   Adherence: confirmed.   Efficacy: has been taking for years with good results.   Dosing: 50 mg daily  Ankylosing spondylitis, psoriatic arthritis, rheumatoid arthritis: SubQ: Note: May continue methotrexate, glucocorticoids, salicylates, NSAIDs, or analgesics during etanercept therapy. Once-weekly dosing: 50 mg once weekly; maximum dose (rheumatoid arthritis): 50 mg/week. Twice-weekly dosing (off-label dose): 25 mg twice weekly (Bathon 2000; Calin 2004; Rosana Hoes 2003; Genovese 2002; Mease 2000; Mease 2002)  Screening: TB test: completed  Hepatitis B: completed   Monitoring: Injection site reactions: none  S/sx of infections: none  S/sx of malignancy: none  GI upset: none   O:  Lab Results  Component Value Date   WBC 8.1 04/17/2014   HGB 12.2 04/17/2014   HCT 37.7 04/17/2014   MCV 80.2 04/17/2014   PLT 345 04/17/2014      Chemistry      Component Value Date/Time   NA 139 03/31/2008 2243   K 4.9 03/31/2008 2243   CL 103 03/31/2008 2243   CO2 22 03/31/2008 2243   BUN 18 03/31/2008 2243   CREATININE 0.58 03/31/2008 2243      Component Value Date/Time   CALCIUM 9.2 03/31/2008 2243   ALKPHOS 85 03/31/2008 2243   AST 14 03/31/2008 2243   ALT 21 03/31/2008 2243   BILITOT 0.3 03/31/2008 2243       A/P: 1. Medication review: patient currently on Enbrel for RA and is tolerating it well. Reviewed the medication with the patient, including the following: Enbrel (etanercept) binds tumor necrosis factor (TNF) and blocks its interaction with cell surface receptors. TNF plays an important role in the inflammatory processes of many diseases. Patient educated on purpose, proper use and potential adverse effects of Enbrel. SubQ: Administer subcutaneously. Rotate injection sites; may inject into the thigh (preferred),  abdomen (avoiding the 2-inch area around the navel), or outer areas of upper arm. New injections should be given at least 1 inch from an old site and never into areas where the skin is tender, bruised, red, or hard; any raised thick, red, or scaly skin patches or lesions; or areas with scars or stretch marks. For a more comfortable injection, allow autoinjectors, prefilled syringes, and dose trays to reach room temperature for 15 to 30 minutes (?30 minutes for autoinjector) prior to injection; do not remove the needle cover while allowing product to reach room temperature. There may be small white particles of protein in the solution; this is not unusual for proteinaceous solutions. Possible adverse effects include rash, GI upset, increased risk of infection, and injection site reactions. Patients should stop Enbrel if they develop a serious infection. There is a possible increased risk in lymphoma and other malignancies. No recommendations for any changes.   Benard Halsted, PharmD, [place], [place] [phone]

## 2021-03-26 ENCOUNTER — Other Ambulatory Visit (HOSPITAL_COMMUNITY): Payer: Self-pay

## 2021-03-26 MED ORDER — QSYMIA 3.75-23 MG PO CP24
ORAL_CAPSULE | ORAL | 0 refills | Status: DC
  Filled 2021-03-26: qty 30, 30d supply, fill #0

## 2021-03-29 ENCOUNTER — Other Ambulatory Visit (HOSPITAL_COMMUNITY): Payer: Self-pay

## 2021-03-30 ENCOUNTER — Other Ambulatory Visit (HOSPITAL_COMMUNITY): Payer: Self-pay

## 2021-03-30 MED ORDER — PREDNISONE 10 MG PO TABS: ORAL_TABLET | ORAL | 0 refills | Status: DC

## 2021-03-30 MED ORDER — PREDNISONE 10 MG PO TABS
ORAL_TABLET | ORAL | 0 refills | Status: DC
  Filled 2021-03-30: qty 25, 20d supply, fill #0

## 2021-04-01 ENCOUNTER — Other Ambulatory Visit (HOSPITAL_COMMUNITY): Payer: Self-pay

## 2021-04-06 ENCOUNTER — Other Ambulatory Visit (HOSPITAL_COMMUNITY): Payer: Self-pay

## 2021-04-15 ENCOUNTER — Other Ambulatory Visit (HOSPITAL_COMMUNITY): Payer: Self-pay

## 2021-04-15 DIAGNOSIS — E538 Deficiency of other specified B group vitamins: Secondary | ICD-10-CM | POA: Diagnosis not present

## 2021-04-15 DIAGNOSIS — Z6841 Body Mass Index (BMI) 40.0 and over, adult: Secondary | ICD-10-CM | POA: Diagnosis not present

## 2021-04-15 DIAGNOSIS — Z3041 Encounter for surveillance of contraceptive pills: Secondary | ICD-10-CM | POA: Diagnosis not present

## 2021-04-15 MED ORDER — PHENTERMINE HCL 15 MG PO CAPS
ORAL_CAPSULE | ORAL | 0 refills | Status: DC
  Filled 2021-04-15: qty 30, 30d supply, fill #0

## 2021-04-15 MED ORDER — NORETHINDRONE ACETATE 5 MG PO TABS
7.5000 mg | ORAL_TABLET | Freq: Every day | ORAL | 2 refills | Status: DC
  Filled 2021-04-15: qty 45, 30d supply, fill #0
  Filled 2021-06-01 – 2021-06-02 (×2): qty 45, 30d supply, fill #1
  Filled 2021-07-29: qty 45, 30d supply, fill #2

## 2021-04-15 MED ORDER — TOPIRAMATE 25 MG PO TABS
ORAL_TABLET | ORAL | 0 refills | Status: DC
  Filled 2021-04-15: qty 30, 30d supply, fill #0

## 2021-04-15 MED ORDER — CYANOCOBALAMIN 1000 MCG/ML IJ SOLN
INTRAMUSCULAR | 4 refills | Status: DC
  Filled 2021-04-15: qty 1, 28d supply, fill #0
  Filled 2022-01-05: qty 1, 28d supply, fill #1

## 2021-04-23 ENCOUNTER — Other Ambulatory Visit (HOSPITAL_COMMUNITY): Payer: Self-pay

## 2021-04-27 ENCOUNTER — Other Ambulatory Visit (HOSPITAL_COMMUNITY): Payer: Self-pay

## 2021-05-14 ENCOUNTER — Other Ambulatory Visit (HOSPITAL_COMMUNITY): Payer: Self-pay

## 2021-05-14 MED ORDER — LEVOFLOXACIN 750 MG PO TABS
ORAL_TABLET | ORAL | 0 refills | Status: DC
  Filled 2021-05-14: qty 5, 5d supply, fill #0

## 2021-05-18 ENCOUNTER — Other Ambulatory Visit (HOSPITAL_COMMUNITY): Payer: Self-pay

## 2021-05-18 MED ORDER — TOPIRAMATE 25 MG PO TABS
ORAL_TABLET | ORAL | 0 refills | Status: DC
  Filled 2021-05-18: qty 30, 30d supply, fill #0

## 2021-05-18 MED ORDER — PHENTERMINE HCL 30 MG PO CAPS
ORAL_CAPSULE | ORAL | 0 refills | Status: DC
  Filled 2021-05-18: qty 30, 30d supply, fill #0

## 2021-05-21 ENCOUNTER — Other Ambulatory Visit (HOSPITAL_COMMUNITY): Payer: Self-pay

## 2021-05-21 DIAGNOSIS — M1712 Unilateral primary osteoarthritis, left knee: Secondary | ICD-10-CM | POA: Diagnosis not present

## 2021-05-21 DIAGNOSIS — M0579 Rheumatoid arthritis with rheumatoid factor of multiple sites without organ or systems involvement: Secondary | ICD-10-CM | POA: Diagnosis not present

## 2021-05-22 ENCOUNTER — Other Ambulatory Visit: Payer: Self-pay

## 2021-05-22 ENCOUNTER — Ambulatory Visit
Admission: RE | Admit: 2021-05-22 | Discharge: 2021-05-22 | Disposition: A | Discharge status: Home / Self Care | Payer: 59 | Source: Ambulatory Visit

## 2021-05-22 DIAGNOSIS — Z1231 Encounter for screening mammogram for malignant neoplasm of breast: Secondary | ICD-10-CM

## 2021-05-25 ENCOUNTER — Other Ambulatory Visit (HOSPITAL_COMMUNITY): Payer: Self-pay

## 2021-05-25 MED ORDER — METHOTREXATE 2.5 MG PO TABS
ORAL_TABLET | ORAL | 2 refills | Status: DC
  Filled 2021-05-25 – 2021-06-02 (×2): qty 24, 28d supply, fill #0
  Filled 2022-01-05: qty 24, 28d supply, fill #1

## 2021-06-01 ENCOUNTER — Other Ambulatory Visit (HOSPITAL_COMMUNITY): Payer: Self-pay

## 2021-06-02 ENCOUNTER — Other Ambulatory Visit (HOSPITAL_COMMUNITY): Payer: Self-pay

## 2021-06-17 DIAGNOSIS — M0579 Rheumatoid arthritis with rheumatoid factor of multiple sites without organ or systems involvement: Secondary | ICD-10-CM | POA: Diagnosis not present

## 2021-06-17 DIAGNOSIS — H04123 Dry eye syndrome of bilateral lacrimal glands: Secondary | ICD-10-CM | POA: Diagnosis not present

## 2021-06-17 DIAGNOSIS — Z79899 Other long term (current) drug therapy: Secondary | ICD-10-CM | POA: Diagnosis not present

## 2021-06-17 DIAGNOSIS — R682 Dry mouth, unspecified: Secondary | ICD-10-CM | POA: Diagnosis not present

## 2021-06-23 ENCOUNTER — Other Ambulatory Visit (HOSPITAL_COMMUNITY): Payer: Self-pay

## 2021-06-23 MED ORDER — PHENTERMINE HCL 37.5 MG PO CAPS
ORAL_CAPSULE | ORAL | 0 refills | Status: DC
  Filled 2021-06-23: qty 30, 30d supply, fill #0

## 2021-06-28 ENCOUNTER — Other Ambulatory Visit (HOSPITAL_COMMUNITY): Payer: Self-pay

## 2021-07-07 ENCOUNTER — Other Ambulatory Visit (HOSPITAL_COMMUNITY): Payer: Self-pay

## 2021-07-07 MED ORDER — METHYLPREDNISOLONE 4 MG PO TBPK
ORAL_TABLET | ORAL | 0 refills | Status: DC
  Filled 2021-07-07: qty 21, 6d supply, fill #0

## 2021-07-07 MED ORDER — AZITHROMYCIN 250 MG PO TABS
ORAL_TABLET | ORAL | 0 refills | Status: DC
Start: 2021-07-07 — End: 2021-07-21
  Filled 2021-07-07: qty 6, 5d supply, fill #0

## 2021-07-14 ENCOUNTER — Other Ambulatory Visit (HOSPITAL_COMMUNITY): Payer: Self-pay

## 2021-07-14 MED ORDER — CIPROFLOXACIN HCL 0.3 % OP SOLN
OPHTHALMIC | 0 refills | Status: DC
  Filled 2021-07-14: qty 5, 12d supply, fill #0

## 2021-07-15 ENCOUNTER — Other Ambulatory Visit (HOSPITAL_COMMUNITY): Payer: Self-pay

## 2021-07-20 ENCOUNTER — Other Ambulatory Visit (HOSPITAL_COMMUNITY): Payer: Self-pay

## 2021-07-21 ENCOUNTER — Other Ambulatory Visit (HOSPITAL_COMMUNITY): Payer: Self-pay

## 2021-07-23 ENCOUNTER — Other Ambulatory Visit (HOSPITAL_COMMUNITY): Payer: Self-pay

## 2021-07-27 ENCOUNTER — Other Ambulatory Visit (HOSPITAL_COMMUNITY): Payer: Self-pay

## 2021-07-29 ENCOUNTER — Other Ambulatory Visit: Payer: Self-pay | Admitting: Pharmacist

## 2021-07-29 ENCOUNTER — Other Ambulatory Visit (HOSPITAL_COMMUNITY): Payer: Self-pay

## 2021-07-29 MED ORDER — ENBREL SURECLICK 50 MG/ML ~~LOC~~ SOAJ: SUBCUTANEOUS | 2 refills | Status: DC

## 2021-07-29 MED ORDER — ENBREL SURECLICK 50 MG/ML ~~LOC~~ SOAJ
SUBCUTANEOUS | 2 refills | Status: DC
  Filled 2021-07-29: qty 4, 28d supply, fill #0
  Filled 2021-10-01: qty 4, 28d supply, fill #1
  Filled 2021-10-26 – 2021-11-08 (×2): qty 4, 28d supply, fill #2
  Filled 2021-12-01: qty 4, 28d supply, fill #3
  Filled 2021-12-27: qty 4, 28d supply, fill #4
  Filled 2022-03-15: qty 4, 28d supply, fill #5
  Filled 2022-04-21: qty 4, 28d supply, fill #6
  Filled 2022-07-05 (×2): qty 4, 28d supply, fill #7

## 2021-07-29 MED ORDER — PHENTERMINE HCL 37.5 MG PO CAPS
ORAL_CAPSULE | ORAL | 0 refills | Status: DC
  Filled 2021-07-29: qty 30, 30d supply, fill #0

## 2021-08-04 ENCOUNTER — Other Ambulatory Visit (HOSPITAL_COMMUNITY): Payer: Self-pay

## 2021-08-04 MED ORDER — ERGOCALCIFEROL 1.25 MG (50000 UT) PO CAPS
ORAL_CAPSULE | ORAL | 2 refills | Status: DC
  Filled 2021-08-04: qty 30, 90d supply, fill #0
  Filled 2021-09-01: qty 30, 70d supply, fill #0
  Filled 2022-01-05: qty 30, 70d supply, fill #1
  Filled 2022-04-08: qty 30, 90d supply, fill #2

## 2021-08-11 ENCOUNTER — Other Ambulatory Visit (HOSPITAL_BASED_OUTPATIENT_CLINIC_OR_DEPARTMENT_OTHER): Payer: Self-pay

## 2021-08-11 MED ORDER — ERYTHROMYCIN 5 MG/GM OP OINT
TOPICAL_OINTMENT | OPHTHALMIC | 11 refills | Status: DC
  Filled 2021-08-11: qty 3.5, 7d supply, fill #0

## 2021-08-12 ENCOUNTER — Other Ambulatory Visit (HOSPITAL_COMMUNITY): Payer: Self-pay

## 2021-08-12 MED ORDER — DICLOFENAC SODIUM 1 % EX GEL
CUTANEOUS | 0 refills | Status: DC
  Filled 2021-08-12: qty 100, 10d supply, fill #0
  Filled 2021-09-01: qty 100, 4d supply, fill #0

## 2021-08-12 MED ORDER — METHOTREXATE 2.5 MG PO TABS
ORAL_TABLET | ORAL | 2 refills | Status: DC
  Filled 2021-08-12 – 2021-09-01 (×2): qty 24, 28d supply, fill #0
  Filled 2021-10-15: qty 24, 28d supply, fill #1
  Filled 2021-11-29: qty 24, 28d supply, fill #2

## 2021-08-18 ENCOUNTER — Other Ambulatory Visit (HOSPITAL_BASED_OUTPATIENT_CLINIC_OR_DEPARTMENT_OTHER): Payer: Self-pay

## 2021-08-18 ENCOUNTER — Other Ambulatory Visit (HOSPITAL_COMMUNITY): Payer: Self-pay

## 2021-08-18 MED ORDER — ERYTHROMYCIN 5 MG/GM OP OINT
TOPICAL_OINTMENT | OPHTHALMIC | 11 refills | Status: DC
  Filled 2021-08-18 – 2021-09-01 (×2): qty 3.5, 7d supply, fill #0

## 2021-08-20 ENCOUNTER — Other Ambulatory Visit (HOSPITAL_COMMUNITY): Payer: Self-pay

## 2021-08-21 ENCOUNTER — Other Ambulatory Visit (HOSPITAL_COMMUNITY): Payer: Self-pay

## 2021-09-01 ENCOUNTER — Other Ambulatory Visit (HOSPITAL_COMMUNITY): Payer: Self-pay

## 2021-09-01 MED ORDER — PHENTERMINE HCL 37.5 MG PO CAPS
ORAL_CAPSULE | ORAL | 0 refills | Status: DC
  Filled 2021-09-01: qty 30, 30d supply, fill #0

## 2021-09-06 ENCOUNTER — Other Ambulatory Visit (HOSPITAL_COMMUNITY): Payer: Self-pay

## 2021-09-14 ENCOUNTER — Other Ambulatory Visit (HOSPITAL_COMMUNITY): Payer: Self-pay

## 2021-09-14 MED ORDER — LEVOFLOXACIN 750 MG PO TABS
ORAL_TABLET | ORAL | 0 refills | Status: DC
  Filled 2021-09-14: qty 3, 3d supply, fill #0

## 2021-09-19 ENCOUNTER — Other Ambulatory Visit (HOSPITAL_COMMUNITY): Payer: Self-pay

## 2021-09-20 ENCOUNTER — Other Ambulatory Visit (HOSPITAL_COMMUNITY): Payer: Self-pay

## 2021-09-20 MED ORDER — NORETHINDRONE ACETATE 5 MG PO TABS
7.5000 mg | ORAL_TABLET | Freq: Every day | ORAL | 0 refills | Status: DC
  Filled 2021-09-20: qty 45, 30d supply, fill #0

## 2021-09-22 ENCOUNTER — Other Ambulatory Visit (HOSPITAL_COMMUNITY): Payer: Self-pay

## 2021-09-28 DIAGNOSIS — N750 Cyst of Bartholin's gland: Secondary | ICD-10-CM | POA: Diagnosis not present

## 2021-10-01 ENCOUNTER — Other Ambulatory Visit (HOSPITAL_COMMUNITY): Payer: Self-pay

## 2021-10-04 ENCOUNTER — Other Ambulatory Visit (HOSPITAL_COMMUNITY): Payer: Self-pay

## 2021-10-14 ENCOUNTER — Other Ambulatory Visit (HOSPITAL_COMMUNITY): Payer: Self-pay

## 2021-10-14 DIAGNOSIS — Z0000 Encounter for general adult medical examination without abnormal findings: Secondary | ICD-10-CM | POA: Diagnosis not present

## 2021-10-14 DIAGNOSIS — F5104 Psychophysiologic insomnia: Secondary | ICD-10-CM | POA: Diagnosis not present

## 2021-10-14 DIAGNOSIS — Z79899 Other long term (current) drug therapy: Secondary | ICD-10-CM | POA: Diagnosis not present

## 2021-10-14 DIAGNOSIS — Z6841 Body Mass Index (BMI) 40.0 and over, adult: Secondary | ICD-10-CM | POA: Diagnosis not present

## 2021-10-14 DIAGNOSIS — F411 Generalized anxiety disorder: Secondary | ICD-10-CM | POA: Diagnosis not present

## 2021-10-14 DIAGNOSIS — R300 Dysuria: Secondary | ICD-10-CM | POA: Diagnosis not present

## 2021-10-14 MED ORDER — OZEMPIC (0.25 OR 0.5 MG/DOSE) 2 MG/3ML ~~LOC~~ SOPN
PEN_INJECTOR | SUBCUTANEOUS | 0 refills | Status: DC
Start: 2021-10-14 — End: 2021-12-02
  Filled 2021-10-14: qty 3, 56d supply, fill #0
  Filled 2021-11-10: qty 3, 28d supply, fill #0

## 2021-10-14 MED ORDER — ZOLPIDEM TARTRATE 10 MG PO TABS
ORAL_TABLET | ORAL | 0 refills | Status: DC
  Filled 2021-10-14: qty 30, 30d supply, fill #0

## 2021-10-15 ENCOUNTER — Other Ambulatory Visit (HOSPITAL_COMMUNITY): Payer: Self-pay

## 2021-10-15 MED ORDER — PHENTERMINE HCL 37.5 MG PO CAPS
ORAL_CAPSULE | ORAL | 0 refills | Status: DC
  Filled 2021-10-15 – 2021-10-25 (×2): qty 30, 30d supply, fill #0

## 2021-10-16 ENCOUNTER — Other Ambulatory Visit (HOSPITAL_COMMUNITY): Payer: Self-pay

## 2021-10-19 ENCOUNTER — Other Ambulatory Visit (HOSPITAL_COMMUNITY): Payer: Self-pay

## 2021-10-23 ENCOUNTER — Other Ambulatory Visit (HOSPITAL_COMMUNITY): Payer: Self-pay

## 2021-10-25 ENCOUNTER — Other Ambulatory Visit (HOSPITAL_COMMUNITY): Payer: Self-pay

## 2021-10-26 ENCOUNTER — Other Ambulatory Visit (HOSPITAL_COMMUNITY): Payer: Self-pay

## 2021-11-04 ENCOUNTER — Other Ambulatory Visit (HOSPITAL_COMMUNITY): Payer: Self-pay

## 2021-11-05 ENCOUNTER — Other Ambulatory Visit (HOSPITAL_COMMUNITY): Payer: Self-pay

## 2021-11-08 ENCOUNTER — Other Ambulatory Visit (HOSPITAL_COMMUNITY): Payer: Self-pay

## 2021-11-09 ENCOUNTER — Other Ambulatory Visit (HOSPITAL_COMMUNITY): Payer: Self-pay

## 2021-11-10 ENCOUNTER — Other Ambulatory Visit (HOSPITAL_COMMUNITY): Payer: Self-pay

## 2021-11-10 MED ORDER — METHOTREXATE 2.5 MG PO TABS
ORAL_TABLET | ORAL | 2 refills | Status: DC
  Filled 2021-11-10: qty 24, 28d supply, fill #0

## 2021-11-10 MED ORDER — NORETHINDRONE ACETATE 5 MG PO TABS
7.5000 mg | ORAL_TABLET | Freq: Every day | ORAL | 0 refills | Status: DC
  Filled 2021-11-10 – 2021-11-29 (×2): qty 45, 30d supply, fill #0

## 2021-11-11 ENCOUNTER — Other Ambulatory Visit (HOSPITAL_COMMUNITY): Payer: Self-pay

## 2021-11-18 ENCOUNTER — Other Ambulatory Visit (HOSPITAL_COMMUNITY): Payer: Self-pay

## 2021-11-19 ENCOUNTER — Other Ambulatory Visit (HOSPITAL_COMMUNITY): Payer: Self-pay

## 2021-11-23 ENCOUNTER — Other Ambulatory Visit (HOSPITAL_COMMUNITY): Payer: Self-pay

## 2021-11-25 ENCOUNTER — Other Ambulatory Visit (HOSPITAL_COMMUNITY): Payer: Self-pay

## 2021-11-29 ENCOUNTER — Other Ambulatory Visit (HOSPITAL_COMMUNITY): Payer: Self-pay

## 2021-11-29 MED ORDER — PHENTERMINE HCL 37.5 MG PO CAPS
ORAL_CAPSULE | ORAL | 0 refills | Status: DC
  Filled 2021-11-29: qty 30, 30d supply, fill #0

## 2021-12-01 ENCOUNTER — Other Ambulatory Visit (HOSPITAL_COMMUNITY): Payer: Self-pay

## 2021-12-02 ENCOUNTER — Other Ambulatory Visit (HOSPITAL_COMMUNITY): Payer: Self-pay

## 2021-12-02 DIAGNOSIS — M1712 Unilateral primary osteoarthritis, left knee: Secondary | ICD-10-CM | POA: Diagnosis not present

## 2021-12-15 ENCOUNTER — Other Ambulatory Visit (HOSPITAL_COMMUNITY): Payer: Self-pay

## 2021-12-15 MED ORDER — VALACYCLOVIR HCL 1 G PO TABS
ORAL_TABLET | ORAL | 3 refills | Status: DC
  Filled 2021-12-15 – 2022-02-14 (×2): qty 90, 90d supply, fill #0
  Filled 2022-08-10: qty 90, 90d supply, fill #1

## 2021-12-17 ENCOUNTER — Other Ambulatory Visit (HOSPITAL_COMMUNITY): Payer: Self-pay

## 2021-12-23 ENCOUNTER — Other Ambulatory Visit (HOSPITAL_COMMUNITY): Payer: Self-pay

## 2021-12-25 ENCOUNTER — Other Ambulatory Visit (HOSPITAL_COMMUNITY): Payer: Self-pay

## 2021-12-27 ENCOUNTER — Other Ambulatory Visit (HOSPITAL_COMMUNITY): Payer: Self-pay

## 2021-12-29 ENCOUNTER — Other Ambulatory Visit (HOSPITAL_COMMUNITY): Payer: Self-pay

## 2022-01-03 ENCOUNTER — Other Ambulatory Visit (HOSPITAL_COMMUNITY): Payer: Self-pay

## 2022-01-03 ENCOUNTER — Ambulatory Visit
Admission: EM | Admit: 2022-01-03 | Discharge: 2022-01-03 | Disposition: A | Discharge status: Home / Self Care | Payer: 59 | Attending: Internal Medicine | Admitting: Internal Medicine

## 2022-01-03 DIAGNOSIS — N3001 Acute cystitis with hematuria: Secondary | ICD-10-CM | POA: Diagnosis not present

## 2022-01-03 LAB — POCT URINALYSIS DIP (MANUAL ENTRY)
Bilirubin, UA: NEGATIVE
Glucose, UA: NEGATIVE mg/dL
Nitrite, UA: POSITIVE — AB
Spec Grav, UA: 1.025 (ref 1.010–1.025)
Urobilinogen, UA: 1 E.U./dL
pH, UA: 6 (ref 5.0–8.0)

## 2022-01-03 MED ORDER — CEPHALEXIN 500 MG PO CAPS: 500.0000 mg | ORAL_CAPSULE | Freq: Two times a day (BID) | ORAL | 0 refills | Status: AC

## 2022-01-03 NOTE — ED Provider Notes (Signed)
[facility]    CSN: [id number] Arrival date & time: 01/03/22  1739      History   Chief Complaint Chief Complaint  Patient presents with   Urinary Frequency    I think I have an UTI, symptoms frequent urination, cloudy, pelvic and low back pains, foul smell. - Entered by patient    HPI Daisy [name] is a 41 y.o. female comes to the urgent care with 3-day history of pain with urination, frequency of urination and urgency.  Patient denies any lower back pain.  No nausea or vomiting.  No fever or chills.  No trauma to the back.  No vaginal discharge.   HPI  Past Medical History:  Diagnosis Date   Anemia    Anxiety    Arthritis 11/2013   avascular arthritis left [place] oa   Migraines    Pelvic pain    PONV (postoperative nausea and vomiting)    Rheumatoid arthritis ([facility])    Tightness of left heel cord 11/2013    Patient Active Problem List   Diagnosis Date Noted   Pelvic pain 09/19/2017   Left lower quadrant pain 12/06/2015   Trichomonas infection 12/06/2015   Arthritis of foot, left 11/21/2013   KNEE PAIN, LEFT 03/31/2008   RHEUMATOID ARTHRITIS 03/12/2008   PAIN IN JOINT, HAND 03/12/2008    Past Surgical History:  Procedure Laterality Date   ANKLE FUSION Left 11/21/2013   Procedure: LEFT TALONAVICULAR ARTHRODESIS;  Surgeon: Wylene Simmer, MD;  Location: [facility];  Service: Orthopedics;  Laterality: Left;   BILATERAL SALPINGECTOMY Bilateral 04/17/2014   Procedure: BILATERAL SALPINGECTOMY;  Surgeon: Ena Dawley, MD;  Location: [facility];  Service: Gynecology;  Laterality: Bilateral;   CESAREAN SECTION  11/16/2002   DILATION AND CURETTAGE OF UTERUS     DILITATION & CURRETTAGE/HYSTROSCOPY WITH NOVASURE ABLATION N/A 04/17/2014   Procedure: DILATATION & CURETTAGE/HYSTEROSCOPY WITH NOVASURE ABLATION;  Surgeon: Ena Dawley, MD;  Location: [facility];  Service: Gynecology;  Laterality: N/A;   GASTROCNEMIUS RECESSION Left 11/21/2013   Procedure: LEFT  GASTROC RECESSION;  Surgeon: Wylene Simmer, MD;  Location: [facility];  Service: Orthopedics;  Laterality: Left;   LAPAROSCOPIC OVARIAN CYSTECTOMY Left 04/17/2014   Procedure: LAPAROSCOPIC OVARIAN CYSTECTOMY;  Surgeon: Ena Dawley, MD;  Location: [facility];  Service: Gynecology;  Laterality: Left;   TUBAL LIGATION  07/27/2005   laparoscopic    OB History     Gravida  3   Para  3   Term  3   Preterm      AB      Living  3      SAB      IAB      Ectopic      Multiple      Live Births  3            Home Medications    Prior to Admission medications   Medication Sig Start Date End Date Taking? Authorizing Provider  cephALEXin (KEFLEX) 500 MG capsule Take 1 capsule (500 mg total) by mouth 2 (two) times daily for 5 days. 01/03/22 01/08/22 Yes Calvary Difranco, Myrene Galas, MD  cyanocobalamin (,VITAMIN B-12,) 1000 MCG/ML injection Inject 1047mg (177m as directed every 28 days for 5 doses 04/15/21     diclofenac Sodium (VOLTAREN) 1 % GEL Apply 2-4 grams to the affected skin areas four times a day.  Do not exceed a  total dose of 32 g per day over all affected joints. Use  the enclosed dosing card to measure the appropriate dose. 08/11/21     ENBREL SURECLICK 50 MG/ML injection Inject 1 mL (50 mg total) into the skin every 7 days. 07/29/21   Tresa Garter, MD  ergocalciferol (VITAMIN D2) 1.25 MG (50000 UT) capsule Take 1 capsule by mouth every 3 days 08/04/21   Sherrine Maples, MD  erythromycin ophthalmic ointment Place into both eyes nightly, and as needed throughout the day. 4/74/25     folic acid (FOLVITE) 1 MG tablet Take 1 mg by mouth daily.    [provider]  ibuprofen (ADVIL,MOTRIN) 800 MG tablet Take 1 tablet (800 mg total) by mouth every 8 (eight) hours as needed. 04/17/14   Ena Dawley, MD  methotrexate (RHEUMATREX) 2.5 MG tablet Take 2.5 mg by mouth once a week. 6 tabs weekly on monday 04/25/13   [provider]  methotrexate  (RHEUMATREX) 2.5 MG tablet TAKE 6 TABLETS BY MOUTH ONCE WEEKLY. 05/25/21     methotrexate (RHEUMATREX) 2.5 MG tablet TAKE 6 TABLETS BY MOUTH ONCE WEEKLY. 08/11/21     methotrexate (RHEUMATREX) 2.5 MG tablet TAKE 6 TABLETS BY MOUTH ONCE WEEKLY. 11/10/21     norethindrone (AYGESTIN) 5 MG tablet TAKE 1 AND 1/2 TABLETS BY MOUTH DAILY. 11/10/21     phentermine 15 MG capsule Take 1 capsule (15 mg total) by mouth every morning for 30 days. 04/15/21     phentermine 30 MG capsule Take 1 capsule (30 mg total) by mouth every morning for 30 days. 05/18/21     phentermine 37.5 MG capsule Take 37.5 mg by mouth every morning. Daily at 500 am    [provider]  phentermine 37.5 MG capsule Take 1 capsule by mouth every morning. 11/29/21     Phentermine-Topiramate (QSYMIA) 3.75-23 MG CP24 Take 1 capsule by mouth daily for 30 days. 03/26/21     predniSONE (DELTASONE) 10 MG tablet Take 2 tablets (20 mg total) by mouth daily for 5 days, THEN 1 and 1/2 tablets (15 mg total) daily for 5 days, THEN 1 tablet (10 mg total) daily for 5 days, THEN 1/2 tablet (5 mg total) daily for 5 days. 03/30/21     predniSONE (DELTASONE) 10 MG tablet Take 2 tablets (20 mg total) by mouth daily for 5 days, THEN 1.5 tablets (15 mg total) daily for 5 days, THEN 1 tablet (10 mg total) daily for 5 days, THEN 0.5 tablets (5 mg total) daily for 5 days. 03/30/21     predniSONE (DELTASONE) 10 MG tablet Take 2 tablets (20 mg total) by mouth daily for 5 days, THEN 1.5 tablets (15 mg total) daily for 5 days, THEN 1 tablet (10 mg total) daily for 5 days, THEN 0.5 tablets (5 mg total) daily for 5 days. 03/30/21     topiramate (TOPAMAX) 25 MG tablet Take 25 mg by mouth daily.    [provider]  valACYclovir (VALTREX) 1000 MG tablet Take 1 tablet (1,000 mg total) by mouth daily as needed (OUTBREAK). 12/15/21     valACYclovir (VALTREX) 500 MG tablet Take 1,000 mg by mouth 2 (two) times daily as needed (for outbreaks). PATIENT NEEDS OFFICE VISIT FOR  ADDITIONAL REFILLS 12/27/13   Posey Boyer, MD  zolpidem (AMBIEN) 10 MG tablet Take 1 tablet (10 mg total) by mouth nightly as needed for sleep. 10/14/21       Family History Family History  Problem Relation Age of Onset   Cancer Mother    Cancer Father  Asthma Daughter    Asthma Son    Breast cancer Neg Hx     Social History Social History   Tobacco Use   Smoking status: Former    Packs/day: 1.00    Years: 4.00    Total pack years: 4.00    Types: Cigarettes   Smokeless tobacco: Never   Tobacco comments:    quit smoking 2010  Vaping Use   Vaping Use: Never used  Substance Use Topics   Alcohol use: Yes    Comment: social   Drug use: No     Allergies   Codeine, Tomato, Percocet [oxycodone-acetaminophen], and Tramadol   Review of Systems Review of Systems  Gastrointestinal: Negative.   Genitourinary:  Positive for dysuria, frequency and urgency. Negative for hematuria and vaginal discharge.     Physical Exam Triage Vital Signs ED Triage Vitals  Enc Vitals Group     BP 01/03/22 1754 116/76     Pulse Rate 01/03/22 1751 90     Resp 01/03/22 1751 18     Temp 01/03/22 1751 97.8 F (36.6 C)     Temp Source 01/03/22 1751 Oral     SpO2 01/03/22 1751 100 %     Weight --      Height --      Head Circumference --      Peak Flow --      Pain Score 01/03/22 1752 5     Pain Loc --      Pain Edu? --      Excl. in [place]? --    No data found.  Updated Vital Signs BP 116/76   Pulse 90   Temp 97.8 F (36.6 C) (Oral)   Resp 18   SpO2 100%   Visual Acuity Right Eye Distance:   Left Eye Distance:   Bilateral Distance:    Right Eye Near:   Left Eye Near:    Bilateral Near:     Physical Exam Vitals and nursing note reviewed.  Constitutional:      General: She is not in acute distress.    Appearance: She is not ill-appearing.  Cardiovascular:     Rate and Rhythm: Normal rate and regular rhythm.  Pulmonary:     Effort: Pulmonary effort is normal.      Breath sounds: Normal breath sounds.  Abdominal:     General: Bowel sounds are normal.     Palpations: Abdomen is soft.     Tenderness: There is no abdominal tenderness.     Hernia: No hernia is present.  Musculoskeletal:        General: Normal range of motion.  Neurological:     Mental Status: She is alert.      UC Treatments / Results  Labs (all labs ordered are listed, but only abnormal results are displayed) Labs Reviewed  POCT URINALYSIS DIP (MANUAL ENTRY) - Abnormal; Notable for the following components:      Result Value   Ketones, POC UA trace (5) (*)    Blood, UA small (*)    Protein Ur, POC trace (*)    Nitrite, UA Positive (*)    Leukocytes, UA Small (1+) (*)    All other components within normal limits  URINE CULTURE    EKG   Radiology No results found.  Procedures Procedures (including critical care time)  Medications Ordered in UC Medications - No data to display  Initial Impression / Assessment and Plan / UC Course  I have reviewed  the triage vital signs and the nursing notes.  Pertinent labs & imaging results that were available during my care of the patient were reviewed by me and considered in my medical decision making (see chart for details).     1.  Acute cystitis with hematuria: Point-of-care urinalysis is significant for leukocyte esterase, nitrite, blood Urine cultures have been sent Keflex 500 mg twice daily for 5 days We will call patient with recommendations Return precautions given Final Clinical Impressions(s) / UC Diagnoses   Final diagnoses:  Acute cystitis with hematuria     Discharge Instructions      Increase oral fluid intake We will call you with recommendations if cultures require as to change antibiotics Return to urgent care if you have any other concerns    ED Prescriptions     Medication Sig Dispense Auth. Provider   cephALEXin (KEFLEX) 500 MG capsule Take 1 capsule (500 mg total) by mouth 2 (two) times  daily for 5 days. 10 capsule Ferrell Flam, Myrene Galas, MD      PDMP not reviewed this encounter.   Chase Picket, MD 01/03/22 [phone]

## 2022-01-03 NOTE — ED Triage Notes (Signed)
Pt presents with recurrent urinary frequency, lower pelvic pain, and lower back pain the past few days.

## 2022-01-03 NOTE — Discharge Instructions (Addendum)
Increase oral fluid intake We will call you with recommendations if cultures require as to change antibiotics Return to urgent care if you have any other concerns

## 2022-01-04 ENCOUNTER — Other Ambulatory Visit (HOSPITAL_COMMUNITY): Payer: Self-pay

## 2022-01-05 ENCOUNTER — Other Ambulatory Visit (HOSPITAL_COMMUNITY): Payer: Self-pay

## 2022-01-05 LAB — URINE CULTURE: Culture: >=100000 — AB

## 2022-01-05 MED ORDER — PHENTERMINE HCL 37.5 MG PO CAPS
37.5000 mg | ORAL_CAPSULE | Freq: Every morning | ORAL | 0 refills | Status: DC
  Filled 2022-01-05: qty 30, 30d supply, fill #0

## 2022-01-07 ENCOUNTER — Other Ambulatory Visit (HOSPITAL_COMMUNITY): Payer: Self-pay

## 2022-01-14 ENCOUNTER — Ambulatory Visit: Payer: 59 | Admitting: Nurse Practitioner

## 2022-01-14 ENCOUNTER — Encounter: Payer: Self-pay | Admitting: Nurse Practitioner

## 2022-01-14 VITALS — BP 125/86 | HR 82 | Temp 97.1°F | Ht 63.0 in | Wt 226.0 lb

## 2022-01-14 DIAGNOSIS — G4700 Insomnia, unspecified: Secondary | ICD-10-CM | POA: Diagnosis not present

## 2022-01-14 DIAGNOSIS — R350 Frequency of micturition: Secondary | ICD-10-CM | POA: Diagnosis not present

## 2022-01-14 DIAGNOSIS — E559 Vitamin D deficiency, unspecified: Secondary | ICD-10-CM | POA: Insufficient documentation

## 2022-01-14 DIAGNOSIS — G43909 Migraine, unspecified, not intractable, without status migrainosus: Secondary | ICD-10-CM | POA: Insufficient documentation

## 2022-01-14 DIAGNOSIS — G43709 Chronic migraine without aura, not intractable, without status migrainosus: Secondary | ICD-10-CM | POA: Diagnosis not present

## 2022-01-14 DIAGNOSIS — E538 Deficiency of other specified B group vitamins: Secondary | ICD-10-CM | POA: Insufficient documentation

## 2022-01-14 DIAGNOSIS — M25562 Pain in left knee: Secondary | ICD-10-CM | POA: Diagnosis not present

## 2022-01-14 DIAGNOSIS — M069 Rheumatoid arthritis, unspecified: Secondary | ICD-10-CM | POA: Diagnosis not present

## 2022-01-14 DIAGNOSIS — N3000 Acute cystitis without hematuria: Secondary | ICD-10-CM | POA: Diagnosis not present

## 2022-01-14 LAB — POCT URINALYSIS DIPSTICK
Bilirubin, UA: NEGATIVE
Glucose, UA: NEGATIVE
Ketones, UA: NEGATIVE
Nitrite, UA: POSITIVE
Protein, UA: NEGATIVE
Spec Grav, UA: 1.015 (ref 1.010–1.025)
Urobilinogen, UA: 0.2 E.U./dL
pH, UA: 7 (ref 5.0–8.0)

## 2022-01-14 MED ORDER — LEVOFLOXACIN 500 MG PO TABS: 500.0000 mg | ORAL_TABLET | Freq: Every day | ORAL | 0 refills | Status: AC

## 2022-01-14 NOTE — Assessment & Plan Note (Signed)
Chronic, stable.  She follows with rheumatology and is taking Enbrel injections and methotrexate.  Continue collaboration's recommendations with rheumatology.

## 2022-01-14 NOTE — Assessment & Plan Note (Signed)
Chronic, stable.  She takes Ambien 10 mg at bedtime as needed for sleep.  She states that she does not need to take this every night.  She does not need a refill at this time.

## 2022-01-14 NOTE — Assessment & Plan Note (Signed)
Chronic, ongoing.  She has left knee osteoarthritis.  She is following with orthopedics and getting knee injections.  She is trying to hold off on knee replacement as [place] as she can.

## 2022-01-14 NOTE — Assessment & Plan Note (Signed)
Chronic, stable.  She currently takes Topamax 25 mg daily which is preventing her migraines.  She takes Excedrin if she has an acute headache.  Follow-up if symptoms worsen or with any concerns.

## 2022-01-14 NOTE — Patient Instructions (Signed)
It was great to see you!  Start levaquin 1 tablet daily for 7 days.   Let's follow-up in 3-6 months, sooner if you have concerns.  If a referral was placed today, you will be contacted for an appointment. Please note that routine referrals can sometimes take up to 3-4 weeks to process. Please call our office if you haven't heard anything after this time frame.  Take care,  Vance Peper, NP

## 2022-01-14 NOTE — Assessment & Plan Note (Signed)
BMI is 40.  She is following with [facility] weight management and is currently taking phentermine 37.5 mg and Topamax 25 mg daily.  Continue collaboration recommendations from weight management.

## 2022-01-14 NOTE — Progress Notes (Signed)
New Patient Office Visit  Subjective    Patient ID: Daisy [name], female    DOB: [birth year]  Age: 41 y.o. MRN: [id number]  CC:  Chief Complaint  Patient presents with   Establish Care    Np. Est care. Pt c/o left side flank pain w/ pelvic pain, urinary frequency, bladder pressure x1 wk. Pt states after taking antibiotic symptoms did not go away completely    HPI Daisy [name] presents for new patient visit to establish care.  Introduced to Designer, [employer]ellery role and practice setting.  All questions answered.  Discussed provider/patient relationship and expectations.  She went to urgent care for a UTI 2 weeks ago and was given keflex. She still endorses lower back pain, cloudy/foul smelling urine, and urinary frequency. She denies fever, nausea and vomiting. She has also been trying to increase her fluids.   She has a history of insomnia and is taking ambien '10mg'$  as needed. She is not having any side effects.  History of migraines. Takes excedrin to help acute headache. She also started taking topamax which has decreased frequency of her headaches and is helping with weight loss.   She has a history of RA. She follows with rheumatology and is currently taking Enbrel injection and methotrexate.  Symptoms are well controlled.  She is following with [facility] weight management.  She is taking phentermine 37.5 mg and Topamax 25 mg daily.  She states that this regimen worked well for a little while, however she is noticing less weight loss.  She does not take the phentermine every day.  Depression and Anxiety Screen done:     01/14/2022    2:24 PM  Depression screen PHQ 2/9  Decreased Interest 2  Down, Depressed, Hopeless 0  PHQ - 2 Score 2  Altered sleeping 1  Tired, decreased energy 1  Change in appetite 1  Feeling bad or failure about yourself  0  Trouble concentrating 0  Moving slowly or fidgety/restless 0  Suicidal thoughts 0  PHQ-9 Score 5  Difficult doing work/chores  Not difficult at all      01/14/2022    2:25 PM  GAD 7 : Generalized Anxiety Score  Nervous, Anxious, on Edge 3  Control/stop worrying 3  Worry too much - different things 3  Trouble relaxing 3  Restless 0  Easily annoyed or irritable 3  Afraid - awful might happen 3  Total GAD 7 Score 18  Anxiety Difficulty Very difficult    Outpatient Encounter Medications as of 01/14/2022  Medication Sig   levofloxacin (LEVAQUIN) 500 MG tablet Take 1 tablet (500 mg total) by mouth daily for 7 days.   cyanocobalamin (,VITAMIN B-12,) 1000 MCG/ML injection Inject 1087mg (165m as directed every 28 days for 5 doses   diclofenac Sodium (VOLTAREN) 1 % GEL Apply 2-4 grams to the affected skin areas four times a day.  Do not exceed a  total dose of 32 g per day over all affected joints. Use the enclosed dosing card to measure the appropriate dose.   ENBREL SURECLICK 50 MG/ML injection Inject 1 mL (50 mg total) into the skin every 7 days.   ergocalciferol (VITAMIN D2) 1.25 MG (50000 UT) capsule Take 1 capsule by mouth every 3 days   erythromycin ophthalmic ointment Place into both eyes nightly, and as needed throughout the day.   methotrexate (RHEUMATREX) 2.5 MG tablet Take 2.5 mg by mouth once a week. 6 tabs weekly on monday   norethindrone (AYGESTIN)  5 MG tablet TAKE 1 AND 1/2 TABLETS BY MOUTH DAILY.   phentermine 37.5 MG capsule Take 1 capsule by mouth every morning.   topiramate (TOPAMAX) 25 MG tablet Take 25 mg by mouth daily.   valACYclovir (VALTREX) 1000 MG tablet Take 1 tablet (1,000 mg total) by mouth daily as needed (OUTBREAK).   zolpidem (AMBIEN) 10 MG tablet Take 1 tablet (10 mg total) by mouth nightly as needed for sleep.   [DISCONTINUED] folic acid (FOLVITE) 1 MG tablet Take 1 mg by mouth daily.   [DISCONTINUED] ibuprofen (ADVIL,MOTRIN) 800 MG tablet Take 1 tablet (800 mg total) by mouth every 8 (eight) hours as needed.   [DISCONTINUED] methotrexate (RHEUMATREX) 2.5 MG tablet TAKE 6 TABLETS BY  MOUTH ONCE WEEKLY.   [DISCONTINUED] methotrexate (RHEUMATREX) 2.5 MG tablet TAKE 6 TABLETS BY MOUTH ONCE WEEKLY.   [DISCONTINUED] methotrexate (RHEUMATREX) 2.5 MG tablet TAKE 6 TABLETS BY MOUTH ONCE WEEKLY.   [DISCONTINUED] phentermine 15 MG capsule Take 1 capsule (15 mg total) by mouth every morning for 30 days.   [DISCONTINUED] phentermine 30 MG capsule Take 1 capsule (30 mg total) by mouth every morning for 30 days.   [DISCONTINUED] phentermine 37.5 MG capsule Take 37.5 mg by mouth every morning. Daily at 500 am   [DISCONTINUED] Phentermine-Topiramate (QSYMIA) 3.75-23 MG CP24 Take 1 capsule by mouth daily for 30 days.   [DISCONTINUED] predniSONE (DELTASONE) 10 MG tablet Take 2 tablets (20 mg total) by mouth daily for 5 days, THEN 1 and 1/2 tablets (15 mg total) daily for 5 days, THEN 1 tablet (10 mg total) daily for 5 days, THEN 1/2 tablet (5 mg total) daily for 5 days.   [DISCONTINUED] predniSONE (DELTASONE) 10 MG tablet Take 2 tablets (20 mg total) by mouth daily for 5 days, THEN 1.5 tablets (15 mg total) daily for 5 days, THEN 1 tablet (10 mg total) daily for 5 days, THEN 0.5 tablets (5 mg total) daily for 5 days.   [DISCONTINUED] predniSONE (DELTASONE) 10 MG tablet Take 2 tablets (20 mg total) by mouth daily for 5 days, THEN 1.5 tablets (15 mg total) daily for 5 days, THEN 1 tablet (10 mg total) daily for 5 days, THEN 0.5 tablets (5 mg total) daily for 5 days.   [DISCONTINUED] valACYclovir (VALTREX) 500 MG tablet Take 1,000 mg by mouth 2 (two) times daily as needed (for outbreaks). PATIENT NEEDS OFFICE VISIT FOR ADDITIONAL REFILLS   No facility-administered encounter medications on file as of 01/14/2022.    Past Medical History:  Diagnosis Date   Anemia    Anxiety    Arthritis 11/2013   avascular arthritis left talon oa   Endometriosis    Migraines    Pelvic pain    PONV (postoperative nausea and vomiting)    Rheumatoid arthritis (HCC)    Tightness of left heel cord 11/2013   Vitamin  B12 deficiency    Vitamin D deficiency     Past Surgical History:  Procedure Laterality Date   ANKLE FUSION Left 11/21/2013   Procedure: LEFT TALONAVICULAR ARTHRODESIS;  Surgeon: Wylene Simmer, MD;  Location: [place];  Service: Orthopedics;  Laterality: Left;   BILATERAL SALPINGECTOMY Bilateral 04/17/2014   Procedure: BILATERAL SALPINGECTOMY;  Surgeon: Ena Dawley, MD;  Location: [facility];  Service: Gynecology;  Laterality: Bilateral;   CESAREAN SECTION  11/16/2002   DILATION AND CURETTAGE OF UTERUS     DILITATION & CURRETTAGE/HYSTROSCOPY WITH NOVASURE ABLATION N/A 04/17/2014   Procedure: DILATATION & CURETTAGE/HYSTEROSCOPY WITH NOVASURE ABLATION;  Surgeon: Ena Dawley, MD;  Location: [facility];  Service: Gynecology;  Laterality: N/A;   GASTROCNEMIUS RECESSION Left 11/21/2013   Procedure: LEFT GASTROC RECESSION;  Surgeon: Wylene Simmer, MD;  Location: [place];  Service: Orthopedics;  Laterality: Left;   LAPAROSCOPIC OVARIAN CYSTECTOMY Left 04/17/2014   Procedure: LAPAROSCOPIC OVARIAN CYSTECTOMY;  Surgeon: Ena Dawley, MD;  Location: [facility];  Service: Gynecology;  Laterality: Left;   TUBAL LIGATION  07/27/2005   laparoscopic    Family History  Problem Relation Age of Onset   COPD Mother    Cancer Mother        lung   Cancer Father        throat/mouth   Asthma Daughter    Asthma Son    Cancer Paternal Uncle        prostate   Breast cancer Neg Hx     Social History   Socioeconomic History   Marital status: Single    Spouse name: Not on file   Number of children: Not on file   Years of education: Not on file   Highest education level: Not on file  Occupational History   Not on file  Tobacco Use   Smoking status: Former    Packs/day: 1.00    Years: 4.00    Total pack years: 4.00    Types: Cigarettes    Quit date: 2004    Years since quitting: 19.5   Smokeless tobacco: Never   Tobacco comments:    quit smoking 2010  Vaping Use   Vaping  Use: Never used  Substance and Sexual Activity   Alcohol use: Yes    Comment: social   Drug use: No   Sexual activity: Yes    Birth control/protection: Surgical  Other Topics Concern   Not on file  Social History Narrative   Not on file   Social Determinants of Health   Financial Resource Strain: Not on file  Food Insecurity: Not on file  Transportation Needs: Not on file  Physical Activity: Not on file  Stress: Not on file  Social Connections: Not on file  Intimate Partner Violence: Not on file    Review of Systems  Constitutional:  Positive for malaise/fatigue. Negative for fever.  HENT: Negative.    Eyes:  Negative for blurred vision and pain.       Dry eye  Respiratory: Negative.    Cardiovascular: Negative.   Gastrointestinal: Negative.   Genitourinary:  Positive for frequency and urgency. Negative for dysuria.  Musculoskeletal:  Positive for joint pain (left knee).  Skin: Negative.   Neurological: Negative.   Psychiatric/Behavioral:  Negative for depression. The patient is nervous/anxious.      Objective    BP 125/86   Pulse 82   Temp (!) 97.1 F (36.2 C) (Temporal)   Ht '5\' 3"'$  (1.6 m)   Wt 226 lb (102.5 kg)   SpO2 100%   BMI 40.03 kg/m   Physical Exam Vitals and nursing note reviewed.  Constitutional:      General: She is not in acute distress.    Appearance: Normal appearance.  HENT:     Head: Normocephalic and atraumatic.     Right Ear: Tympanic membrane, ear canal and external ear normal.     Left Ear: Tympanic membrane, ear canal and external ear normal.     Nose: Nose normal.  Cardiovascular:     Rate and Rhythm: Normal rate and regular rhythm.     Pulses: Normal  pulses.     Heart sounds: Normal heart sounds.  Pulmonary:     Effort: Pulmonary effort is normal.     Breath sounds: Normal breath sounds.  Abdominal:     Palpations: Abdomen is soft.     Tenderness: There is no abdominal tenderness. There is no right CVA tenderness or left CVA  tenderness.  Musculoskeletal:        General: Normal range of motion.     Cervical back: Normal range of motion and neck supple. No tenderness.     Right lower leg: No edema.     Left lower leg: No edema.  Lymphadenopathy:     Cervical: No cervical adenopathy.  Skin:    General: Skin is warm and dry.  Neurological:     General: No focal deficit present.     Mental Status: She is alert and oriented to person, place, and time.  Psychiatric:        Mood and Affect: Mood normal.        Behavior: Behavior normal.        Thought Content: Thought content normal.        Judgment: Judgment normal.      Assessment & Plan:   Problem List Items Addressed This Visit       Cardiovascular and Mediastinum   Migraines    Chronic, stable.  She currently takes Topamax 25 mg daily which is preventing her migraines.  She takes Excedrin if she has an acute headache.  Follow-up if symptoms worsen or with any concerns.        Musculoskeletal and Integument   Rheumatoid arthritis (HCC)    Chronic, stable.  She follows with rheumatology and is taking Enbrel injections and methotrexate.  Continue collaboration's recommendations with rheumatology.        Other   KNEE PAIN, LEFT    Chronic, ongoing.  She has left knee osteoarthritis.  She is following with orthopedics and getting knee injections.  She is trying to hold off on knee replacement as [place] as she can.      Vitamin D deficiency    She has a history of vitamin D deficiency and is taking an over-the-counter supplement, will have her continue this regimen and check labs at next visit.      Vitamin B12 deficiency    She is currently taking vitamin B12 injections for low vitamin B12 levels.  We will continue this regimen.      Insomnia    Chronic, stable.  She takes Ambien 10 mg at bedtime as needed for sleep.  She states that she does not need to take this every night.  She does not need a refill at this time.      Morbid obesity  ([place])    BMI is 40.  She is following with [facility] weight management and is currently taking phentermine 37.5 mg and Topamax 25 mg daily.  Continue collaboration recommendations from weight management.      Other Visit Diagnoses     Acute cystitis without hematuria    -  Primary   Urine positive 3+ leukocytes and was treated with keflex 2 weeks ago. Will check urine culture. Start levaquin '500mg'$  daily x7 days.    Relevant Orders   Urine Culture   Urinary frequency       U/A positive 3+ leukocytes. Will treat for UTI   Relevant Orders   POCT urinalysis dipstick (Completed)       Return in  about 3 months (around 04/16/2022) for 3-6 months , CPE.   Daisy Dancer, NP

## 2022-01-14 NOTE — Assessment & Plan Note (Signed)
She is currently taking vitamin B12 injections for low vitamin B12 levels.  We will continue this regimen.

## 2022-01-14 NOTE — Assessment & Plan Note (Addendum)
She has a history of vitamin D deficiency and is taking an over-the-counter supplement, will have her continue this regimen and check labs at next visit.

## 2022-01-15 LAB — URINE CULTURE
MICRO NUMBER:: 13594377
SPECIMEN QUALITY:: ADEQUATE

## 2022-01-20 ENCOUNTER — Other Ambulatory Visit (HOSPITAL_COMMUNITY): Payer: Self-pay

## 2022-01-24 ENCOUNTER — Other Ambulatory Visit (HOSPITAL_COMMUNITY): Payer: Self-pay

## 2022-02-14 ENCOUNTER — Other Ambulatory Visit (HOSPITAL_COMMUNITY): Payer: Self-pay

## 2022-02-14 MED ORDER — PHENTERMINE HCL 37.5 MG PO CAPS
ORAL_CAPSULE | ORAL | 0 refills | Status: DC
  Filled 2022-02-14 – 2022-04-21 (×3): qty 30, 30d supply, fill #0

## 2022-02-14 MED ORDER — NORETHINDRONE ACETATE 5 MG PO TABS
7.5000 mg | ORAL_TABLET | Freq: Every day | ORAL | 0 refills | Status: DC
  Filled 2022-02-14: qty 45, 30d supply, fill #0

## 2022-02-15 ENCOUNTER — Other Ambulatory Visit (HOSPITAL_COMMUNITY): Payer: Self-pay

## 2022-02-16 ENCOUNTER — Other Ambulatory Visit (HOSPITAL_COMMUNITY): Payer: Self-pay

## 2022-02-28 ENCOUNTER — Other Ambulatory Visit: Payer: Self-pay | Admitting: Nurse Practitioner

## 2022-03-01 ENCOUNTER — Other Ambulatory Visit (HOSPITAL_COMMUNITY): Payer: Self-pay

## 2022-03-11 NOTE — Telephone Encounter (Signed)
Per pt pcp she will need to get medication from her rheumatologist

## 2022-03-14 DIAGNOSIS — M1712 Unilateral primary osteoarthritis, left knee: Secondary | ICD-10-CM | POA: Diagnosis not present

## 2022-03-15 ENCOUNTER — Other Ambulatory Visit (HOSPITAL_COMMUNITY): Payer: Self-pay

## 2022-03-16 ENCOUNTER — Other Ambulatory Visit (HOSPITAL_COMMUNITY): Payer: Self-pay

## 2022-03-29 ENCOUNTER — Other Ambulatory Visit (HOSPITAL_COMMUNITY): Payer: Self-pay

## 2022-03-30 ENCOUNTER — Other Ambulatory Visit (HOSPITAL_COMMUNITY): Payer: Self-pay

## 2022-03-30 NOTE — Progress Notes (Signed)
Acute Office Visit  Subjective:     Patient ID: Daisy [name], female    DOB: 1981-05-20, 41 y.o.   MRN: [id number]  Chief Complaint  Patient presents with   Cyst    Pt c/o possible vaginal cyst rupturing w/ discharge 2 wks ago. Pt states she now has a foul vaginal odor and abnormal discharge.     HPI Patient is in today for vaginal discharge and odor after a cyst ruptured a few weeks ago. She states that over the past few months, it would get bigger in size and then smaller, however it never caused any pain. She went to her GYN a few months ago and opted for a sitz bath and conservative treatment. She states a couple weeks ago, she went to the bathroom and was wiping and noticed some discharge on the toilet tissue. She has still been doing sitz baths and making sure she keeps herself clean. She has now started having more vaginal odor.  She denies fevers.  ROS See pertinent positives and negatives per HPI.     Objective:    BP 110/80   Pulse 87   Temp (!) 96.9 F (36.1 C) (Temporal)   Wt 230 lb (104.3 kg)   SpO2 98%   BMI 40.74 kg/m    Physical Exam Vitals and nursing note reviewed. Exam conducted with a chaperone present.  Constitutional:      General: She is not in acute distress.    Appearance: Normal appearance.  HENT:     Head: Normocephalic.  Eyes:     Conjunctiva/sclera: Conjunctivae normal.  Pulmonary:     Effort: Pulmonary effort is normal.  Genitourinary:    Exam position: Lithotomy position.     Labia:        Right: No tenderness.        Left: No tenderness.        Comments: Bartholin cyst on right lower labia, nontender Musculoskeletal:     Cervical back: Normal range of motion.  Skin:    General: Skin is warm.  Neurological:     General: No focal deficit present.     Mental Status: She is alert and oriented to person, place, and time.  Psychiatric:        Mood and Affect: Mood normal.        Behavior: Behavior normal.        Thought  Content: Thought content normal.        Judgment: Judgment normal.       Assessment & Plan:   Problem List Items Addressed This Visit       Genitourinary   Bartholin cyst - Primary    Bartholin cyst to right lower labia.  It was draining a few days ago, does not appear to be draining today.  Encouraged her to continue doing sitz bath's and warm compresses.  With vaginal odor, will start Augmentin and metronidazole twice a day for 7 days.  Encouraged her to reach out to her GYN if she is interested in having this drained so it does not recur.      Other Visit Diagnoses     Vaginal discharge       We will check wet prep to rule out BV and yeast.   Relevant Orders   Cervicovaginal ancillary only       Meds ordered this encounter  Medications   amoxicillin-clavulanate (AUGMENTIN) 875-125 MG tablet    Sig: Take 1 tablet by mouth 2 (  two) times daily.    Dispense:  14 tablet    Refill:  0   metroNIDAZOLE (FLAGYL) 500 MG tablet    Sig: Take 1 tablet (500 mg total) by mouth 2 (two) times daily for 7 days.    Dispense:  14 tablet    Refill:  0    Return if symptoms worsen or fail to improve.  Charyl Dancer, NP

## 2022-03-31 ENCOUNTER — Telehealth: Payer: Self-pay | Admitting: Pharmacist

## 2022-03-31 ENCOUNTER — Other Ambulatory Visit (HOSPITAL_COMMUNITY)
Admission: RE | Admit: 2022-03-31 | Discharge: 2022-03-31 | Disposition: A | Discharge status: Home / Self Care | Payer: 59 | Source: Ambulatory Visit | Attending: Nurse Practitioner | Admitting: Nurse Practitioner

## 2022-03-31 ENCOUNTER — Ambulatory Visit: Payer: 59 | Admitting: Nurse Practitioner

## 2022-03-31 ENCOUNTER — Encounter: Payer: Self-pay | Admitting: Nurse Practitioner

## 2022-03-31 VITALS — BP 110/80 | HR 87 | Temp 96.9°F | Wt 230.0 lb

## 2022-03-31 DIAGNOSIS — N898 Other specified noninflammatory disorders of vagina: Secondary | ICD-10-CM | POA: Insufficient documentation

## 2022-03-31 DIAGNOSIS — N750 Cyst of Bartholin's gland: Secondary | ICD-10-CM

## 2022-03-31 MED ORDER — METRONIDAZOLE 500 MG PO TABS: 500.0000 mg | ORAL_TABLET | Freq: Two times a day (BID) | ORAL | 0 refills | Status: AC

## 2022-03-31 MED ORDER — AMOXICILLIN-POT CLAVULANATE 875-125 MG PO TABS: 1.0000 | ORAL_TABLET | Freq: Two times a day (BID) | ORAL | 0 refills | Status: DC

## 2022-03-31 NOTE — Assessment & Plan Note (Signed)
Bartholin cyst to right lower labia.  It was draining a few days ago, does not appear to be draining today.  Encouraged her to continue doing sitz bath's and warm compresses.  With vaginal odor, will start Augmentin and metronidazole twice a day for 7 days.  Encouraged her to reach out to her GYN if she is interested in having this drained so it does not recur.

## 2022-03-31 NOTE — Telephone Encounter (Signed)
Called patient to schedule an appointment for the [employer] [facility]. I was unable to reach the patient so I left a HIPAA-compliant message requesting that the patient return my call.   Luke Van Ausdall, PharmD, BCACP, CPP Clinical Pharmacist [facility] [phone]  

## 2022-03-31 NOTE — Patient Instructions (Addendum)
It was great to see you!  Start augmentin and flagyl twice a day with food for 7 days. Continue doing the sitz baths. Follow-up with GYN if you are interested in having it drained.   Let's follow-up if symptoms worsen or don't improve.   Take care,  Vance Peper, NP

## 2022-04-01 ENCOUNTER — Other Ambulatory Visit (HOSPITAL_COMMUNITY): Payer: Self-pay

## 2022-04-01 LAB — CERVICOVAGINAL ANCILLARY ONLY
Bacterial Vaginitis (gardnerella): POSITIVE — AB
Candida Glabrata: NEGATIVE
Candida Vaginitis: NEGATIVE
Chlamydia: NEGATIVE
Comment: NEGATIVE
Comment: NEGATIVE
Comment: NEGATIVE
Comment: NEGATIVE
Comment: NORMAL
Neisseria Gonorrhea: NEGATIVE

## 2022-04-02 ENCOUNTER — Other Ambulatory Visit (HOSPITAL_COMMUNITY): Payer: Self-pay

## 2022-04-04 ENCOUNTER — Other Ambulatory Visit (HOSPITAL_COMMUNITY): Payer: Self-pay

## 2022-04-06 ENCOUNTER — Other Ambulatory Visit (HOSPITAL_COMMUNITY): Payer: Self-pay

## 2022-04-08 ENCOUNTER — Other Ambulatory Visit (HOSPITAL_COMMUNITY): Payer: Self-pay

## 2022-04-15 DIAGNOSIS — Z79899 Other long term (current) drug therapy: Secondary | ICD-10-CM | POA: Diagnosis not present

## 2022-04-15 DIAGNOSIS — M0579 Rheumatoid arthritis with rheumatoid factor of multiple sites without organ or systems involvement: Secondary | ICD-10-CM | POA: Diagnosis not present

## 2022-04-16 ENCOUNTER — Other Ambulatory Visit (HOSPITAL_COMMUNITY): Payer: Self-pay

## 2022-04-20 ENCOUNTER — Encounter: Payer: Self-pay | Admitting: Nurse Practitioner

## 2022-04-20 ENCOUNTER — Other Ambulatory Visit (HOSPITAL_COMMUNITY): Payer: Self-pay

## 2022-04-20 MED ORDER — NORETHINDRONE ACETATE 5 MG PO TABS
7.5000 mg | ORAL_TABLET | Freq: Every day | ORAL | 3 refills | Status: DC
  Filled 2022-04-20: qty 135, 90d supply, fill #0
  Filled 2022-08-10: qty 135, 90d supply, fill #1
  Filled 2022-12-19 – 2023-01-03 (×2): qty 135, 90d supply, fill #2
  Filled 2023-03-29: qty 135, 90d supply, fill #3

## 2022-04-20 MED ORDER — NORETHINDRONE ACETATE 5 MG PO TABS: 7.5000 mg | ORAL_TABLET | Freq: Every day | ORAL | 3 refills | Status: DC

## 2022-04-20 MED ORDER — METHOTREXATE 2.5 MG PO TABS
15.0000 mg | ORAL_TABLET | ORAL | 2 refills | Status: DC
Start: 2022-04-20 — End: 2022-07-05
  Filled 2022-04-20: qty 24, 28d supply, fill #0

## 2022-04-21 ENCOUNTER — Other Ambulatory Visit (HOSPITAL_COMMUNITY): Payer: Self-pay

## 2022-04-22 ENCOUNTER — Other Ambulatory Visit (HOSPITAL_COMMUNITY): Payer: Self-pay

## 2022-04-25 ENCOUNTER — Other Ambulatory Visit (HOSPITAL_COMMUNITY): Payer: Self-pay

## 2022-04-26 ENCOUNTER — Other Ambulatory Visit (HOSPITAL_COMMUNITY): Payer: Self-pay

## 2022-04-28 ENCOUNTER — Other Ambulatory Visit (HOSPITAL_COMMUNITY): Payer: Self-pay

## 2022-05-05 ENCOUNTER — Encounter: Payer: Self-pay | Admitting: Nurse Practitioner

## 2022-05-05 MED ORDER — TRIAMCINOLONE ACETONIDE 0.1 % EX CREA: 1.0000 | TOPICAL_CREAM | Freq: Two times a day (BID) | CUTANEOUS | 0 refills | Status: AC

## 2022-05-17 ENCOUNTER — Other Ambulatory Visit (HOSPITAL_COMMUNITY): Payer: Self-pay

## 2022-05-20 ENCOUNTER — Other Ambulatory Visit (HOSPITAL_COMMUNITY): Payer: Self-pay

## 2022-05-23 ENCOUNTER — Other Ambulatory Visit (HOSPITAL_COMMUNITY): Payer: Self-pay

## 2022-06-13 DIAGNOSIS — F4321 Adjustment disorder with depressed mood: Secondary | ICD-10-CM | POA: Diagnosis not present

## 2022-06-13 DIAGNOSIS — M1712 Unilateral primary osteoarthritis, left knee: Secondary | ICD-10-CM | POA: Diagnosis not present

## 2022-06-16 ENCOUNTER — Encounter: Payer: 59 | Admitting: Nurse Practitioner

## 2022-06-22 ENCOUNTER — Encounter: Payer: Self-pay | Admitting: Nurse Practitioner

## 2022-06-24 ENCOUNTER — Telehealth: Payer: Self-pay | Admitting: Pharmacist

## 2022-06-24 ENCOUNTER — Encounter: Payer: 59 | Admitting: Nurse Practitioner

## 2022-06-24 NOTE — Telephone Encounter (Signed)
Called patient to schedule an appointment for the  [facility]. I was unable to reach the patient so I left a HIPAA-compliant message requesting that the patient return my call.   Luke Van Ausdall, PharmD, BCACP, CPP Clinical Pharmacist [facility] [phone]  

## 2022-07-04 ENCOUNTER — Encounter: Payer: Self-pay | Admitting: Nurse Practitioner

## 2022-07-05 ENCOUNTER — Other Ambulatory Visit (HOSPITAL_COMMUNITY): Payer: Self-pay

## 2022-07-05 ENCOUNTER — Other Ambulatory Visit: Payer: Self-pay

## 2022-07-05 MED ORDER — METHOTREXATE SODIUM 2.5 MG PO TABS
15.0000 mg | ORAL_TABLET | ORAL | 1 refills | Status: DC
  Filled 2022-07-05: qty 24, 28d supply, fill #0
  Filled 2022-08-10: qty 24, 28d supply, fill #1

## 2022-07-06 ENCOUNTER — Other Ambulatory Visit (HOSPITAL_COMMUNITY): Payer: Self-pay

## 2022-07-07 ENCOUNTER — Other Ambulatory Visit (HOSPITAL_COMMUNITY): Payer: Self-pay

## 2022-07-20 ENCOUNTER — Encounter: Payer: Commercial Managed Care - PPO | Admitting: Nurse Practitioner

## 2022-07-20 ENCOUNTER — Ambulatory Visit: Payer: Commercial Managed Care - PPO | Admitting: Family Medicine

## 2022-07-20 ENCOUNTER — Encounter: Payer: Self-pay | Admitting: Family Medicine

## 2022-07-20 VITALS — BP 122/84 | HR 79 | Temp 99.3°F | Wt 236.0 lb

## 2022-07-20 DIAGNOSIS — J069 Acute upper respiratory infection, unspecified: Secondary | ICD-10-CM

## 2022-07-20 MED ORDER — PROMETHAZINE-DM 6.25-15 MG/5ML PO SYRP: 2.5000 mL | ORAL_SOLUTION | Freq: Four times a day (QID) | ORAL | 0 refills | Status: DC | PRN

## 2022-07-20 MED ORDER — IPRATROPIUM BROMIDE 0.06 % NA SOLN: 2.0000 | Freq: Four times a day (QID) | NASAL | 12 refills | Status: DC

## 2022-07-20 NOTE — Patient Instructions (Signed)
Use prescribed medications as directed

## 2022-07-20 NOTE — Progress Notes (Signed)
Assessment/Plan:   Problem List Items Addressed This Visit   None Visit Diagnoses     Viral URI with cough    -  Primary   Relevant Medications   promethazine-dextromethorphan (PROMETHAZINE-DM) 6.25-15 MG/5ML syrup   ipratropium (ATROVENT) 0.06 % nasal spray      Summary statement of problem: The patient is experiencing symptoms consistent with a viral upper respiratory infection, including chest congestion, cough, runny nose, and an episode of shortness of breath that has since improved. There is no evidence of wheezing or chest pain at this time.  Differential diagnosis:  Upper respiratory infection (most likely given the constellation of symptoms and clinical presentation) Reactive airway disease (considered due to reported shortness of breath and use of albuterol inhaler) Allergic rhinitis (possible given known allergies and presence of symptoms like runny nose) Plan:  Continue over-the-counter measures including cough drops and Theraflu. Utilize albuterol inhaler as needed for symptoms. Add ipratropium (Atrovent) 0.06 % nasal spray to help dry up nasal secretions and potentially reduce postnasal drip and cough. Prescribe promethazine-dextromethorphan syrup to help with cough suppression as needed. Monitor symptoms and advise return to clinic or seek emergency care for worsening symptoms such as chest pain or difficulty breathing. Provide a work note to excuse the patient from work from Monday through Friday to allow for recovery time. Follow-up as needed if symptoms persist or worsen.    Subjective:  HPI:  Subjective: HPI: The patient is a 42 year old female with a past medical history significant for rheumatoid arthritis (RA), vitamin D deficiency, vitamin B12 deficiency, migraines, insomnia, and obesity. She presents with a chief complaint of chest congestion, cough, runny nose, and shortness of breath that started last Friday. She also has known allergies for which she  regularly takes allergy medication.  She reports a history of similar symptoms over the summer and has been using her albuterol inhaler as needed for symptomatic relief. She denies chest pain but describes soreness from coughing and does not report any wheezing. She has also been using over-the-counter cold remedies including cough drops and Theraflu. There is a noted exposure to RSV around Christmas time.  Past Surgical History: The patient has undergone multiple surgical procedures including left ankle fusion, bilateral salpingectomy, cesarean section, dilation and curettage of the uterus, gastrocnemius recession, and laparoscopic ovarian cystectomy.  Outpatient Medications Prior to Visit: The patient is currently taking medications including vitamin B-12 injections, diclofenac sodium gel, Enbrel, ergocalciferol, erythromycin ophthalmic ointment, methotrexate, norethindrone, phentermine, topiramate, triamcinolone cream, valacyclovir, zolpidem, and amoxicillin-clavulanate.  Family History: The family history is significant for COPD in the mother, cancer (lung) in the mother, cancer (throat/mouth) in the father, asthma in daughter and son, prostate cancer in a paternal uncle, and breast cancer in the family but with a negative history for the patient.  Social History: The patient is a former smoker, having quit smoking in 2010, and drinks alcohol socially. She is single with no documented substance use and sexual activity using surgical birth control/protection.                                            Objective:  Physical Exam: BP 122/84 (BP Location: Left Arm, Patient Position: Sitting, Cuff Size: Large)   Pulse 79   Temp 99.3 F (37.4 C) (Oral)   Wt 236 lb (107 kg)   SpO2 100%   BMI 41.81 kg/m  Gen: NAD, resting comfortably CV: RRR with no murmurs appreciated Pulm: NWOB, CTAB with no crackles, wheezes, or rhonchi GI: Normal bowel sounds present. Soft, Nontender,  Nondistended. MSK: no edema, cyanosis, or clubbing noted Skin: warm, dry Neuro: grossly normal, moves all extremities Psych: Normal affect and thought content       Alesia Banda, MD, MS

## 2022-07-25 NOTE — Progress Notes (Deleted)
There were no vitals taken for this visit.   Subjective:    Patient ID: Daisy [name], female    DOB: 12-24-1980, 42 y.o.   MRN: [id number]  CC: No chief complaint on file.   HPI: Daisy [name] is a 42 y.o. female presenting on 07/26/2022 for comprehensive medical examination. Current medical complaints include:{Blank single:19197::"none","***"}  She currently lives with: Menopausal Symptoms: {Blank single:19197::"yes","no"}  Depression Screen done today and results listed below:     01/14/2022    2:24 PM  Depression screen PHQ 2/9  Decreased Interest 2  Down, Depressed, Hopeless 0  PHQ - 2 Score 2  Altered sleeping 1  Tired, decreased energy 1  Change in appetite 1  Feeling bad or failure about yourself  0  Trouble concentrating 0  Moving slowly or fidgety/restless 0  Suicidal thoughts 0  PHQ-9 Score 5  Difficult doing work/chores Not difficult at all    The patient {has/does not have:19849} a history of falls. I {did/did not:19850} complete a risk assessment for falls. A plan of care for falls {was/was not:19852} documented.   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   Vitamin B12 deficiency    Vitamin D deficiency     Surgical History:  Past Surgical History:  Procedure Laterality Date   ANKLE FUSION Left 11/21/2013   Procedure: LEFT TALONAVICULAR ARTHRODESIS;  Surgeon: Wylene Simmer, MD;  Location: [facility];  Service: Orthopedics;  Laterality: Left;   BILATERAL SALPINGECTOMY Bilateral 04/17/2014   Procedure: BILATERAL SALPINGECTOMY;  Surgeon: Ena Dawley, MD;  Location: [facility];  Service: Gynecology;  Laterality: Bilateral;   CESAREAN SECTION  11/16/2002   DILATION AND CURETTAGE OF UTERUS      DILITATION & CURRETTAGE/HYSTROSCOPY WITH NOVASURE ABLATION N/A 04/17/2014   Procedure: DILATATION & CURETTAGE/HYSTEROSCOPY WITH NOVASURE ABLATION;  Surgeon: Ena Dawley, MD;  Location: [facility];  Service: Gynecology;  Laterality: N/A;   GASTROCNEMIUS RECESSION Left 11/21/2013   Procedure: LEFT GASTROC RECESSION;  Surgeon: Wylene Simmer, MD;  Location: [facility];  Service: Orthopedics;  Laterality: Left;   LAPAROSCOPIC OVARIAN CYSTECTOMY Left 04/17/2014   Procedure: LAPAROSCOPIC OVARIAN CYSTECTOMY;  Surgeon: Ena Dawley, MD;  Location: [facility];  Service: Gynecology;  Laterality: Left;   TUBAL LIGATION  07/27/2005   laparoscopic    Medications:  Current Outpatient Medications on File Prior to Visit  Medication Sig   amoxicillin-clavulanate (AUGMENTIN) 875-125 MG tablet Take 1 tablet by mouth 2 (two) times daily. (Patient not taking: Reported on 07/20/2022)   cyanocobalamin (,VITAMIN B-12,) 1000 MCG/ML injection Inject 1082mg (144m as directed every 28 days for 5 doses   diclofenac Sodium (VOLTAREN) 1 % GEL Apply 2-4 grams to the affected skin areas four times a day.  Do not exceed a  total dose of 32 g per day over all affected joints. Use the enclosed dosing card to measure the appropriate dose.   ENBREL SURECLICK 50 MG/ML injection Inject 1 mL (50 mg total) into the skin every 7 days.   ergocalciferol (VITAMIN D2) 1.25 MG (50000 UT) capsule Take 1 capsule by mouth every 3 days   erythromycin ophthalmic ointment Place into both  eyes nightly, and as needed throughout the day.   ipratropium (ATROVENT) 0.06 % nasal spray Place 2 sprays into both nostrils 4 (four) times daily.   methotrexate (RHEUMATREX) 2.5 MG tablet Take 2.5 mg by mouth once a week. 6 tabs weekly on monday   methotrexate (RHEUMATREX) 2.5 MG tablet Take 6 tablets (15 mg total) by mouth once a week.   norethindrone (AYGESTIN) 5 MG tablet TAKE 1 AND 1/2 TABLETS BY MOUTH DAILY.   phentermine 37.5 MG capsule Take 1  capsule (37.5 mg total) by mouth every morning.   promethazine-dextromethorphan (PROMETHAZINE-DM) 6.25-15 MG/5ML syrup Take 2.5 mLs by mouth 4 (four) times daily as needed for cough.   topiramate (TOPAMAX) 25 MG tablet Take 25 mg by mouth daily.   triamcinolone cream (KENALOG) 0.1 % Apply 1 Application topically 2 (two) times daily.   valACYclovir (VALTREX) 1000 MG tablet Take 1 tablet (1,000 mg total) by mouth daily as needed (OUTBREAK).   zolpidem (AMBIEN) 10 MG tablet Take 1 tablet (10 mg total) by mouth nightly as needed for sleep.   No current facility-administered medications on file prior to visit.    Allergies:  Allergies  Allergen Reactions   Codeine Hives and Nausea And Vomiting    ALSO SWEATING AND DIZZINESS   Tomato Hives   Percocet [Oxycodone-Acetaminophen]     Dizziness and jittery   Tramadol Hives    Hives, swelling, dizziness, itching    Social History:  Social History   Socioeconomic History   Marital status: Single    Spouse name: Not on file   Number of children: Not on file   Years of education: Not on file   Highest education level: Not on file  Occupational History   Not on file  Tobacco Use   Smoking status: Former    Packs/day: 1.00    Years: 4.00    Total pack years: 4.00    Types: Cigarettes    Quit date: 2004    Years since quitting: 20.0   Smokeless tobacco: Never   Tobacco comments:    quit smoking 2010  Vaping Use   Vaping Use: Never used  Substance and Sexual Activity   Alcohol use: Yes    Comment: social   Drug use: No   Sexual activity: Yes    Birth control/protection: Surgical  Other Topics Concern   Not on file  Social History Narrative   Not on file   Social Determinants of Health   Financial Resource Strain: Not on file  Food Insecurity: Not on file  Transportation Needs: Not on file  Physical Activity: Not on file  Stress: Not on file  Social Connections: Not on file  Intimate Partner Violence: Not on file    Social History   Tobacco Use  Smoking Status Former   Packs/day: 1.00   Years: 4.00   Total pack years: 4.00   Types: Cigarettes   Quit date: 2004   Years since quitting: 20.0  Smokeless Tobacco Never  Tobacco Comments   quit smoking 2010   Social History   Substance and Sexual Activity  Alcohol Use Yes   Comment: social    Family History:  Family History  Problem Relation Age of Onset   COPD Mother    Cancer Mother        lung   Cancer Father        throat/mouth   Asthma Daughter    Asthma Son    Cancer Paternal Uncle  prostate   Breast cancer Neg Hx     Past medical history, surgical history, medications, allergies, family history and social history reviewed with patient today and changes made to appropriate areas of the chart.   ROS All other ROS negative except what is listed above and in the HPI.      Objective:    There were no vitals taken for this visit.  Wt Readings from Last 3 Encounters:  07/20/22 236 lb (107 kg)  03/31/22 230 lb (104.3 kg)  01/14/22 226 lb (102.5 kg)    Physical Exam  Results for orders placed or performed in visit on 03/31/22  Cervicovaginal ancillary only  Result Value Ref Range   Neisseria Gonorrhea Negative    Chlamydia Negative    Bacterial Vaginitis (gardnerella) Positive (A)    Candida Vaginitis Negative    Candida Glabrata Negative    Comment      Normal Reference Range Bacterial Vaginosis - Negative   Comment Normal Reference Range Candida Species - Negative    Comment Normal Reference Range Candida Galbrata - Negative    Comment Normal Reference Ranger Chlamydia - Negative    Comment      Normal Reference Range Neisseria Gonorrhea - Negative      Assessment & Plan:   Problem List Items Addressed This Visit   None    Follow up plan: No follow-ups on file.   LABORATORY TESTING:  - Pap smear: {Blank TIRWER:15400::"QQP done","not applicable","up to date","done elsewhere"}  IMMUNIZATIONS:    - Tdap: Tetanus vaccination status reviewed: last tetanus booster within 10 years. - Influenza: {Blank single:19197::"Up to date","Administered today","Postponed to flu season","Refused","Given elsewhere"} - Pneumovax: Not applicable - Prevnar: Not applicable - HPV: Not applicable - Zostavax vaccine: Not applicable  SCREENING: -Mammogram: {Blank single:19197::"Up to date","Ordered today","Not applicable","Refused","Done elsewhere"}  - Colonoscopy: Not applicable  - Bone Density: Not applicable  -Hearing Test: Not applicable  -Spirometry: Not applicable   PATIENT COUNSELING:   Advised to take 1 mg of folate supplement per day if capable of pregnancy.   Sexuality: Discussed sexually transmitted diseases, partner selection, use of condoms, avoidance of unintended pregnancy  and contraceptive alternatives.   Advised to avoid cigarette smoking.  I discussed with the patient that most people either abstain from alcohol or drink within safe limits (<=14/week and <=4 drinks/occasion for males, <=7/weeks and <= 3 drinks/occasion for females) and that the risk for alcohol disorders and other health effects rises proportionally with the number of drinks per week and how often a drinker exceeds daily limits.  Discussed cessation/primary prevention of drug use and availability of treatment for abuse.   Diet: Encouraged to adjust caloric intake to maintain  or achieve ideal body weight, to reduce intake of dietary saturated fat and total fat, to limit sodium intake by avoiding high sodium foods and not adding table salt, and to maintain adequate dietary potassium and calcium preferably from fresh fruits, vegetables, and low-fat dairy products.    stressed the importance of regular exercise  Injury prevention: Discussed safety belts, safety helmets, smoke detector, smoking near bedding or upholstery.   Dental health: Discussed importance of regular tooth brushing, flossing, and dental visits.     NEXT PREVENTATIVE PHYSICAL DUE IN 1 YEAR. No follow-ups on file.

## 2022-07-26 ENCOUNTER — Other Ambulatory Visit (HOSPITAL_COMMUNITY): Payer: Self-pay

## 2022-07-26 ENCOUNTER — Telehealth: Payer: Self-pay | Admitting: Nurse Practitioner

## 2022-07-26 ENCOUNTER — Encounter: Payer: Commercial Managed Care - PPO | Admitting: Nurse Practitioner

## 2022-07-26 NOTE — Telephone Encounter (Signed)
Pt walked In past grace period- thought it was 15 min. Rescheduled CPE for later date. First, letter sent

## 2022-07-27 ENCOUNTER — Other Ambulatory Visit (HOSPITAL_COMMUNITY): Payer: Self-pay

## 2022-07-28 ENCOUNTER — Other Ambulatory Visit (HOSPITAL_COMMUNITY): Payer: Self-pay

## 2022-08-02 ENCOUNTER — Other Ambulatory Visit (HOSPITAL_COMMUNITY): Payer: Self-pay

## 2022-08-02 NOTE — Telephone Encounter (Signed)
Noted  

## 2022-08-02 NOTE — Telephone Encounter (Signed)
1st no show, fee waived, letter sent 

## 2022-08-10 ENCOUNTER — Other Ambulatory Visit (HOSPITAL_COMMUNITY): Payer: Self-pay

## 2022-08-10 ENCOUNTER — Encounter: Payer: Commercial Managed Care - PPO | Admitting: Nurse Practitioner

## 2022-08-10 ENCOUNTER — Ambulatory Visit (INDEPENDENT_AMBULATORY_CARE_PROVIDER_SITE_OTHER): Payer: Commercial Managed Care - PPO | Admitting: Nurse Practitioner

## 2022-08-10 ENCOUNTER — Encounter: Payer: Self-pay | Admitting: Nurse Practitioner

## 2022-08-10 VITALS — BP 126/84 | HR 81 | Temp 96.9°F | Ht 63.0 in | Wt 240.4 lb

## 2022-08-10 DIAGNOSIS — Z0000 Encounter for general adult medical examination without abnormal findings: Secondary | ICD-10-CM

## 2022-08-10 DIAGNOSIS — Z13220 Encounter for screening for lipoid disorders: Secondary | ICD-10-CM | POA: Diagnosis not present

## 2022-08-10 DIAGNOSIS — G43709 Chronic migraine without aura, not intractable, without status migrainosus: Secondary | ICD-10-CM

## 2022-08-10 DIAGNOSIS — E559 Vitamin D deficiency, unspecified: Secondary | ICD-10-CM

## 2022-08-10 DIAGNOSIS — Z1231 Encounter for screening mammogram for malignant neoplasm of breast: Secondary | ICD-10-CM

## 2022-08-10 DIAGNOSIS — M069 Rheumatoid arthritis, unspecified: Secondary | ICD-10-CM

## 2022-08-10 DIAGNOSIS — L658 Other specified nonscarring hair loss: Secondary | ICD-10-CM | POA: Diagnosis not present

## 2022-08-10 DIAGNOSIS — E538 Deficiency of other specified B group vitamins: Secondary | ICD-10-CM | POA: Diagnosis not present

## 2022-08-10 LAB — LIPID PANEL
Cholesterol: 155 mg/dL (ref 0–200)
HDL: 44.6 mg/dL (ref >39.00)
LDL Cholesterol: 99 mg/dL (ref 0–99)
NonHDL: 110.72
Total CHOL/HDL Ratio: 3
Triglycerides: 59 mg/dL (ref 0.0–149.0)
VLDL: 11.8 mg/dL (ref 0.0–40.0)

## 2022-08-10 LAB — CBC WITH DIFFERENTIAL/PLATELET
Basophils Absolute: 0 10*3/uL (ref 0.0–0.1)
Basophils Relative: 0.6 % (ref 0.0–3.0)
Eosinophils Absolute: 0.2 10*3/uL (ref 0.0–0.7)
Eosinophils Relative: 2.9 % (ref 0.0–5.0)
HCT: 40.5 % (ref 36.0–46.0)
Hemoglobin: 13.2 g/dL (ref 12.0–15.0)
Lymphocytes Relative: 26.1 % (ref 12.0–46.0)
Lymphs Abs: 1.7 10*3/uL (ref 0.7–4.0)
MCHC: 32.7 g/dL (ref 30.0–36.0)
MCV: 85.5 fl (ref 78.0–100.0)
Monocytes Absolute: 0.8 10*3/uL (ref 0.1–1.0)
Monocytes Relative: 12.7 % — ABNORMAL HIGH (ref 3.0–12.0)
Neutro Abs: 3.8 10*3/uL (ref 1.4–7.7)
Neutrophils Relative %: 57.7 % (ref 43.0–77.0)
Platelets: 342 10*3/uL (ref 150.0–400.0)
RBC: 4.74 Mil/uL (ref 3.87–5.11)
RDW: 13.9 % (ref 11.5–15.5)
WBC: 6.7 10*3/uL (ref 4.0–10.5)

## 2022-08-10 LAB — COMPREHENSIVE METABOLIC PANEL
ALT: 12 U/L (ref 0–35)
AST: 12 U/L (ref 0–37)
Albumin: 3.6 g/dL (ref 3.5–5.2)
Alkaline Phosphatase: 51 U/L (ref 39–117)
BUN: 12 mg/dL (ref 6–23)
CO2: 27 mEq/L (ref 19–32)
Calcium: 8.6 mg/dL (ref 8.4–10.5)
Chloride: 106 mEq/L (ref 96–112)
Creatinine, Ser: 0.63 mg/dL (ref 0.40–1.20)
GFR: 109.35 mL/min (ref >60.00)
Glucose, Bld: 78 mg/dL (ref 70–99)
Potassium: 4.6 mEq/L (ref 3.5–5.1)
Sodium: 138 mEq/L (ref 135–145)
Total Bilirubin: 0.7 mg/dL (ref 0.2–1.2)
Total Protein: 6.6 g/dL (ref 6.0–8.3)

## 2022-08-10 LAB — TSH: TSH: 0.53 u[IU]/mL (ref 0.35–5.50)

## 2022-08-10 LAB — VITAMIN B12: Vitamin B-12: 257 pg/mL (ref 211–911)

## 2022-08-10 LAB — HEMOGLOBIN A1C: Hgb A1c MFr Bld: 5.1 % (ref 4.6–6.5)

## 2022-08-10 LAB — VITAMIN D 25 HYDROXY (VIT D DEFICIENCY, FRACTURES): VITD: 16.85 ng/mL — ABNORMAL LOW (ref 30.00–100.00)

## 2022-08-10 MED ORDER — CLOBETASOL PROPIONATE 0.05 % EX SOLN
1.0000 | Freq: Two times a day (BID) | CUTANEOUS | 3 refills | Status: AC
  Filled 2022-08-10: qty 50, 25d supply, fill #0
  Filled 2022-09-12: qty 50, 25d supply, fill #1

## 2022-08-10 MED ORDER — TOPIRAMATE 25 MG PO TABS
25.0000 mg | ORAL_TABLET | Freq: Every day | ORAL | 1 refills | Status: DC
  Filled 2022-08-10: qty 90, 90d supply, fill #0
  Filled 2022-12-19 – 2023-01-03 (×2): qty 90, 90d supply, fill #1

## 2022-08-10 MED ORDER — ZEPBOUND 2.5 MG/0.5ML ~~LOC~~ SOAJ
2.5000 mg | SUBCUTANEOUS | 1 refills | Status: DC
  Filled 2022-08-10: qty 2, 28d supply, fill #0
  Filled 2022-09-12: qty 2, 28d supply, fill #1

## 2022-08-10 MED ORDER — ERGOCALCIFEROL 1.25 MG (50000 UT) PO CAPS
50000.0000 [IU] | ORAL_CAPSULE | ORAL | 0 refills | Status: DC
  Filled 2022-08-10: qty 12, 84d supply, fill #0

## 2022-08-10 NOTE — Assessment & Plan Note (Signed)
Chronic, stable.  She follows with rheumatology and is taking Enbrel injections and methotrexate.  Continue collaboration recommendations from rheumatology

## 2022-08-10 NOTE — Assessment & Plan Note (Signed)
She is currently taking vitamin B12 injections for low levels.  Will recheck vitamin B12 levels today and adjust regimen based on results.

## 2022-08-10 NOTE — Patient Instructions (Signed)
It was great to see you!  We are checking your labs today and will let you know the results via mychart/phone.   We have scheduled for a mammogram on 05/29/23 at [facility].   Start zepbound injection once a week for weight loss. Make sure you are drinking plenty of fluids.   Let's follow-up in 8 weeks, sooner if you have concerns.  If a referral was placed today, you will be contacted for an appointment. Please note that routine referrals can sometimes take up to 3-4 weeks to process. Please call our office if you haven't heard anything after this time frame.  Take care,  Vance Peper, NP

## 2022-08-10 NOTE — Assessment & Plan Note (Signed)
She has a history of vitamin D deficiency and is taking an over-the-counter supplement daily.  Will check vitamin D levels and adjust regimen based on results.

## 2022-08-10 NOTE — Assessment & Plan Note (Signed)
Chronic, stable.  Will have her continue Topamax 25 mg daily for migraine prevention.

## 2022-08-10 NOTE — Progress Notes (Signed)
BP 126/84   Pulse 81   Temp (!) 96.9 F (36.1 C)   Ht '5\' 3"'$  (1.6 m)   Wt 240 lb 6.4 oz (109 kg)   SpO2 99%   BMI 42.58 kg/m    Subjective:    Patient ID: Daisy [name], female    DOB: 01-05-1981, 42 y.o.   MRN: [id number]  CC: Chief Complaint  Patient presents with   Annual Exam    Physical, discuss weight loss option , patient is fasting for labs     HPI: Daisy [name] is a 42 y.o. female presenting on 08/10/2022 for comprehensive medical examination. Current medical complaints include: Weight management  She has been trying to lose weight for several years.  She was taking phentermine and Topamax in the past which did not fully help.  She states that she has been off the phentermine for about a month and has noticed weight gain.  She has also tried [place] in the past which did not work on its own.  It did work if she took the [place] and phentermine together.  She has been eating 1 meal a day, watching calories.  She has not been exercising as much as she normally does.  The patient does not have a history of falls. I did not complete a risk assessment for falls. A plan of care for falls was not documented.   Past Medical History:  Past Medical History:  Diagnosis Date   Allergy    Seasonal allergies   Anemia    Anxiety    Arthritis 11/2013   avascular arthritis left talon oa   Endometriosis    Migraines    Pelvic pain    PONV (postoperative nausea and vomiting)    Rheumatoid arthritis (HCC)    Tightness of left heel cord 11/2013   Vitamin B12 deficiency    Vitamin D deficiency     Surgical History:  Past Surgical History:  Procedure Laterality Date   ANKLE FUSION Left 11/21/2013   Procedure: LEFT TALONAVICULAR ARTHRODESIS;  Surgeon: Wylene Simmer, MD;  Location: [facility];  Service: Orthopedics;  Laterality: Left;   BILATERAL SALPINGECTOMY Bilateral 04/17/2014   Procedure: BILATERAL SALPINGECTOMY;  Surgeon: Ena Dawley, MD;  Location: [facility];   Service: Gynecology;  Laterality: Bilateral;   CESAREAN SECTION  11/16/2002   DILATION AND CURETTAGE OF UTERUS     DILITATION & CURRETTAGE/HYSTROSCOPY WITH NOVASURE ABLATION N/A 04/17/2014   Procedure: DILATATION & CURETTAGE/HYSTEROSCOPY WITH NOVASURE ABLATION;  Surgeon: Ena Dawley, MD;  Location: [facility];  Service: Gynecology;  Laterality: N/A;   GASTROCNEMIUS RECESSION Left 11/21/2013   Procedure: LEFT GASTROC RECESSION;  Surgeon: Wylene Simmer, MD;  Location: [facility];  Service: Orthopedics;  Laterality: Left;   LAPAROSCOPIC OVARIAN CYSTECTOMY Left 04/17/2014   Procedure: LAPAROSCOPIC OVARIAN CYSTECTOMY;  Surgeon: Ena Dawley, MD;  Location: [facility];  Service: Gynecology;  Laterality: Left;   TUBAL LIGATION  07/27/2005   laparoscopic    Medications:  Current Outpatient Medications on File Prior to Visit  Medication Sig   cyanocobalamin (,VITAMIN B-12,) 1000 MCG/ML injection Inject 1071mg (12m as directed every 28 days for 5 doses   diclofenac Sodium (VOLTAREN) 1 % GEL Apply 2-4 grams to the affected skin areas four times a day.  Do not exceed a  total dose of 32 g per day over all affected joints. Use the enclosed dosing card to measure the appropriate dose.   ENBREL SURECLICK 50 MG/ML injection Inject  1 mL (50 mg total) into the skin every 7 days.   ergocalciferol (VITAMIN D2) 1.25 MG (50000 UT) capsule Take 1 capsule by mouth every 3 days   erythromycin ophthalmic ointment Place into both eyes nightly, and as needed throughout the day.   methotrexate (RHEUMATREX) 2.5 MG tablet Take 6 tablets (15 mg total) by mouth once a week.   norethindrone (AYGESTIN) 5 MG tablet TAKE 1 AND 1/2 TABLETS BY MOUTH DAILY.   triamcinolone cream (KENALOG) 0.1 % Apply 1 Application topically 2 (two) times daily.   valACYclovir (VALTREX) 1000 MG tablet Take 1 tablet (1,000 mg total) by mouth daily as needed (OUTBREAK).   zolpidem (AMBIEN) 10 MG tablet Take 1 tablet (10 mg total) by mouth  nightly as needed for sleep.   No current facility-administered medications on file prior to visit.    Allergies:  Allergies  Allergen Reactions   Codeine Hives and Nausea And Vomiting    ALSO SWEATING AND DIZZINESS   Tomato Hives   Percocet [Oxycodone-Acetaminophen]     Dizziness and jittery   Tramadol Hives    Hives, swelling, dizziness, itching    Social History:  Social History   Socioeconomic History   Marital status: Single    Spouse name: Not on file   Number of children: Not on file   Years of education: Not on file   Highest education level: Not on file  Occupational History   Not on file  Tobacco Use   Smoking status: Former    Packs/day: 1.00    Years: 4.00    Total pack years: 4.00    Types: Cigarettes    Quit date: 2004    Years since quitting: 20.0   Smokeless tobacco: Never   Tobacco comments:    quit smoking 2010  Vaping Use   Vaping Use: Never used  Substance and Sexual Activity   Alcohol use: Yes    Alcohol/week: 4.0 standard drinks of alcohol    Types: 2 Glasses of wine, 2 Standard drinks or equivalent per week    Comment: social   Drug use: No   Sexual activity: Yes    Birth control/protection: Surgical  Other Topics Concern   Not on file  Social History Narrative   Not on file   Social Determinants of Health   Financial Resource Strain: Not on file  Food Insecurity: Not on file  Transportation Needs: Not on file  Physical Activity: Not on file  Stress: Not on file  Social Connections: Not on file  Intimate Partner Violence: Not on file   Social History   Tobacco Use  Smoking Status Former   Packs/day: 1.00   Years: 4.00   Total pack years: 4.00   Types: Cigarettes   Quit date: 2004   Years since quitting: 20.0  Smokeless Tobacco Never  Tobacco Comments   quit smoking 2010   Social History   Substance and Sexual Activity  Alcohol Use Yes   Alcohol/week: 4.0 standard drinks of alcohol   Types: 2 Glasses of wine, 2  Standard drinks or equivalent per week   Comment: social    Family History:  Family History  Problem Relation Age of Onset   COPD Mother    Cancer Mother        lung   Cancer Father        throat/mouth   Asthma Daughter    ADD / ADHD Daughter    Asthma Son    Cancer Paternal Uncle  prostate   Cancer Paternal Uncle    Breast cancer Neg Hx     Past medical history, surgical history, medications, allergies, family history and social history reviewed with patient today and changes made to appropriate areas of the chart.   Review of Systems  Constitutional:  Positive for malaise/fatigue. Negative for fever.  HENT: Negative.    Respiratory:  Positive for cough. Negative for shortness of breath.   Cardiovascular: Negative.   Gastrointestinal: Negative.   Genitourinary: Negative.   Musculoskeletal:  Positive for joint pain.  Skin: Negative.   Neurological: Negative.   Psychiatric/Behavioral:  The patient is nervous/anxious.    All other ROS negative except what is listed above and in the HPI.      Objective:    BP 126/84   Pulse 81   Temp (!) 96.9 F (36.1 C)   Ht '5\' 3"'$  (1.6 m)   Wt 240 lb 6.4 oz (109 kg)   SpO2 99%   BMI 42.58 kg/m   Wt Readings from Last 3 Encounters:  08/10/22 240 lb 6.4 oz (109 kg)  07/20/22 236 lb (107 kg)  03/31/22 230 lb (104.3 kg)    Physical Exam Vitals and nursing note reviewed.  Constitutional:      General: She is not in acute distress.    Appearance: Normal appearance. She is obese.  HENT:     Head: Normocephalic and atraumatic.     Right Ear: Tympanic membrane, ear canal and external ear normal.     Left Ear: Tympanic membrane, ear canal and external ear normal.  Eyes:     Conjunctiva/sclera: Conjunctivae normal.  Cardiovascular:     Rate and Rhythm: Normal rate and regular rhythm.     Pulses: Normal pulses.     Heart sounds: Normal heart sounds.  Pulmonary:     Effort: Pulmonary effort is normal.     Breath sounds:  Normal breath sounds.  Abdominal:     Palpations: Abdomen is soft.     Tenderness: There is no abdominal tenderness.  Musculoskeletal:        General: Normal range of motion.     Cervical back: Normal range of motion and neck supple.     Right lower leg: No edema.     Left lower leg: No edema.  Lymphadenopathy:     Cervical: No cervical adenopathy.  Skin:    General: Skin is warm and dry.  Neurological:     General: No focal deficit present.     Mental Status: She is alert and oriented to person, place, and time.     Cranial Nerves: No cranial nerve deficit.     Coordination: Coordination normal.     Gait: Gait normal.  Psychiatric:        Mood and Affect: Mood normal.        Behavior: Behavior normal.        Thought Content: Thought content normal.        Judgment: Judgment normal.     Results for orders placed or performed in visit on 03/31/22  Cervicovaginal ancillary only  Result Value Ref Range   Neisseria Gonorrhea Negative    Chlamydia Negative    Bacterial Vaginitis (gardnerella) Positive (A)    Candida Vaginitis Negative    Candida Glabrata Negative    Comment      Normal Reference Range Bacterial Vaginosis - Negative   Comment Normal Reference Range Candida Species - Negative    Comment Normal Reference Range Candida  Galbrata - Negative    Comment Normal Reference Ranger Chlamydia - Negative    Comment      Normal Reference Range Neisseria Gonorrhea - Negative      Assessment & Plan:   Problem List Items Addressed This Visit       Cardiovascular and Mediastinum   Migraines    Chronic, stable.  Will have her continue Topamax 25 mg daily for migraine prevention.      Relevant Medications   topiramate (TOPAMAX) 25 MG tablet     Musculoskeletal and Integument   Rheumatoid arthritis (HCC)    Chronic, stable.  She follows with rheumatology and is taking Enbrel injections and methotrexate.  Continue collaboration recommendations from rheumatology         Other   Vitamin D deficiency    She has a history of vitamin D deficiency and is taking an over-the-counter supplement daily.  Will check vitamin D levels and adjust regimen based on results.      Relevant Orders   VITAMIN D 25 Hydroxy (Vit-D Deficiency, Fractures)   Vitamin B12 deficiency    She is currently taking vitamin B12 injections for low levels.  Will recheck vitamin B12 levels today and adjust regimen based on results.      Relevant Orders   Vitamin B12   Morbid obesity (HCC)    BMI 42.5.  She has gained weight since her last visit.  She has been out of her phentermine and not taking this for a few months now.  She is interested in restarting medication for weight loss.  She has been on phentermine 37.5 mg for several months, would like to hold off on this at this point.  She has also tried [place] in the past.  Will have her start Zepbound 2.5 mg injection weekly.  Discussed possible side effects.  She has been eating 1 meal a day at home and limiting calories.  She has not been exercising routinely for the past few months. Follow-up in 2 months.       Relevant Medications   tirzepatide (ZEPBOUND) 2.5 MG/0.5ML Pen   Other Relevant Orders   Hemoglobin A1c   Other Visit Diagnoses     Routine general medical examination at a health care facility    -  Primary   Health maintenance reviewed and updated. Discussed nutrition, exercise. Check CMP, CBC, TSH. Follow-up 1 year.   Relevant Orders   CBC with Differential/Platelet   Comprehensive metabolic panel   TSH   Screening, lipid       Screen lipid panel today   Relevant Orders   Lipid panel   Encounter for screening mammogram for malignant neoplasm of breast       Mammogram ordered today, scheduled for mobile bus on 05/29/23 at 9am.   Relevant Orders   MM 3D SCREEN BREAST BILATERAL        Follow up plan: Return in about 4 months (around 12/09/2022) for weight management.   LABORATORY TESTING:  - Pap smear: up to  date  IMMUNIZATIONS:   - Tdap: Tetanus vaccination status reviewed: will get next visit. - Influenza: Administered today - Pneumovax: Not applicable - Prevnar: Not applicable - HPV: Not applicable - Zostavax vaccine: Not applicable  SCREENING: -Mammogram: Ordered today  - Colonoscopy: Not applicable  - Bone Density: Not applicable  -Hearing Test: Not applicable  -Spirometry: Not applicable   PATIENT COUNSELING:   Advised to take 1 mg of folate supplement per day if capable of  pregnancy.   Sexuality: Discussed sexually transmitted diseases, partner selection, use of condoms, avoidance of unintended pregnancy  and contraceptive alternatives.   Advised to avoid cigarette smoking.  I discussed with the patient that most people either abstain from alcohol or drink within safe limits (<=14/week and <=4 drinks/occasion for males, <=7/weeks and <= 3 drinks/occasion for females) and that the risk for alcohol disorders and other health effects rises proportionally with the number of drinks per week and how often a drinker exceeds daily limits.  Discussed cessation/primary prevention of drug use and availability of treatment for abuse.   Diet: Encouraged to adjust caloric intake to maintain  or achieve ideal body weight, to reduce intake of dietary saturated fat and total fat, to limit sodium intake by avoiding high sodium foods and not adding table salt, and to maintain adequate dietary potassium and calcium preferably from fresh fruits, vegetables, and low-fat dairy products.    stressed the importance of regular exercise  Injury prevention: Discussed safety belts, safety helmets, smoke detector, smoking near bedding or upholstery.   Dental health: Discussed importance of regular tooth brushing, flossing, and dental visits.    NEXT PREVENTATIVE PHYSICAL DUE IN 1 YEAR. Return in about 4 months (around 12/09/2022) for weight management.

## 2022-08-10 NOTE — Addendum Note (Signed)
Addended by: Vance Peper A on: 08/10/2022 09:15 PM   Modules accepted: Orders

## 2022-08-10 NOTE — Assessment & Plan Note (Addendum)
BMI 42.5.  She has gained weight since her last visit.  She has been out of her phentermine and not taking this for a few months now.  She is interested in restarting medication for weight loss.  She has been on phentermine 37.5 mg for several months, would like to hold off on this at this point.  She has also tried [place] in the past.  Will have her start Zepbound 2.5 mg injection weekly.  Discussed possible side effects.  She has been eating 1 meal a day at home and limiting calories.  She has not been exercising routinely for the past few months. Follow-up in 2 months.

## 2022-08-11 ENCOUNTER — Other Ambulatory Visit (HOSPITAL_COMMUNITY): Payer: Self-pay

## 2022-08-11 ENCOUNTER — Encounter: Payer: Commercial Managed Care - PPO | Admitting: Nurse Practitioner

## 2022-08-15 ENCOUNTER — Encounter: Payer: Self-pay | Admitting: Nurse Practitioner

## 2022-08-17 ENCOUNTER — Other Ambulatory Visit (HOSPITAL_COMMUNITY): Payer: Self-pay

## 2022-08-18 ENCOUNTER — Other Ambulatory Visit (HOSPITAL_COMMUNITY): Payer: Self-pay

## 2022-08-18 NOTE — Telephone Encounter (Signed)
Patient Advocate Encounter   Received notification from [employer] that prior authorization for Zepbound is required.   PA submitted on 08/18/2022 [place] Status is pending

## 2022-08-19 ENCOUNTER — Other Ambulatory Visit: Payer: Self-pay

## 2022-08-19 ENCOUNTER — Other Ambulatory Visit (HOSPITAL_COMMUNITY): Payer: Self-pay

## 2022-08-19 NOTE — Telephone Encounter (Signed)
Pharmacy Patient Advocate Encounter  Prior Authorization for Zepbound has been approved.    PA# [id number] Effective dates: 08/18/2022 through 02/14/2023

## 2022-08-19 NOTE — Telephone Encounter (Signed)
I called patient and notified her that Rx of Zepbound was approved.

## 2022-08-22 ENCOUNTER — Other Ambulatory Visit: Payer: Self-pay

## 2022-08-25 DIAGNOSIS — M059 Rheumatoid arthritis with rheumatoid factor, unspecified: Secondary | ICD-10-CM | POA: Diagnosis not present

## 2022-08-25 DIAGNOSIS — M0579 Rheumatoid arthritis with rheumatoid factor of multiple sites without organ or systems involvement: Secondary | ICD-10-CM | POA: Diagnosis not present

## 2022-08-25 DIAGNOSIS — Z79631 Long term (current) use of antimetabolite agent: Secondary | ICD-10-CM | POA: Diagnosis not present

## 2022-08-25 DIAGNOSIS — Z79899 Other long term (current) drug therapy: Secondary | ICD-10-CM | POA: Diagnosis not present

## 2022-08-25 DIAGNOSIS — Z79620 Long term (current) use of immunosuppressive biologic: Secondary | ICD-10-CM | POA: Diagnosis not present

## 2022-09-08 ENCOUNTER — Other Ambulatory Visit (HOSPITAL_COMMUNITY): Payer: Self-pay

## 2022-09-08 ENCOUNTER — Telehealth: Payer: Self-pay | Admitting: Pharmacist

## 2022-09-08 MED ORDER — HUMIRA (2 PEN) 40 MG/0.8ML ~~LOC~~ AJKT: AUTO-INJECTOR | SUBCUTANEOUS | 2 refills | Status: DC

## 2022-09-08 NOTE — Telephone Encounter (Signed)
Called patient to schedule an appointment for the [facility]. I was unable to reach the patient so I left a HIPAA-compliant message requesting that the patient return my call.   Luke Van Ausdall, PharmD, BCACP, CPP Clinical Pharmacist [facility] [phone]  

## 2022-09-09 ENCOUNTER — Other Ambulatory Visit (HOSPITAL_COMMUNITY): Payer: Self-pay

## 2022-09-09 MED ORDER — METHOTREXATE SODIUM 2.5 MG PO TABS
15.0000 mg | ORAL_TABLET | ORAL | 2 refills | Status: AC
  Filled 2022-09-09: qty 24, 28d supply, fill #0
  Filled 2022-10-12 – 2023-01-03 (×3): qty 24, 28d supply, fill #1
  Filled 2023-01-26 – 2023-03-29 (×2): qty 24, 28d supply, fill #2

## 2022-09-09 MED ORDER — PREDNISONE 5 MG PO TABS
ORAL_TABLET | ORAL | 0 refills | Status: DC
  Filled 2022-09-09: qty 30, 12d supply, fill #0

## 2022-09-12 ENCOUNTER — Other Ambulatory Visit: Payer: Self-pay

## 2022-09-12 ENCOUNTER — Encounter (INDEPENDENT_AMBULATORY_CARE_PROVIDER_SITE_OTHER): Payer: Commercial Managed Care - PPO | Admitting: Nurse Practitioner

## 2022-09-12 ENCOUNTER — Other Ambulatory Visit (HOSPITAL_COMMUNITY): Payer: Self-pay

## 2022-09-12 ENCOUNTER — Ambulatory Visit: Payer: Commercial Managed Care - PPO | Attending: Nurse Practitioner | Admitting: Pharmacist

## 2022-09-12 DIAGNOSIS — N3000 Acute cystitis without hematuria: Secondary | ICD-10-CM

## 2022-09-12 DIAGNOSIS — Z79899 Other long term (current) drug therapy: Secondary | ICD-10-CM

## 2022-09-12 MED ORDER — HUMIRA (2 PEN) 40 MG/0.8ML ~~LOC~~ AJKT
AUTO-INJECTOR | SUBCUTANEOUS | 2 refills | Status: DC
  Filled 2022-09-12: qty 2, 28d supply, fill #0
  Filled 2022-10-10 – 2022-11-22 (×2): qty 2, 28d supply, fill #1
  Filled 2023-01-03: qty 2, 28d supply, fill #2

## 2022-09-12 NOTE — Progress Notes (Signed)
  S: Patient presents for review of their specialty medication therapy.  Patient is currently taking Humira for RA. Patient is managed by Dr. Veneta Penton  for this.   Adherence: has not started.   Efficacy: has not started. Was on Enbrel with good control but is no longer effective.   Dosing: Rheumatoid arthritis: SubQ: 40 mg every other week (may continue methotrexate, other nonbiologic DMARDS, corticosteroids, NSAIDs, and/or analgesics); patients not taking concomitant methotrexate may increase dose to 40 mg every week  Dose adjustments: Renal: no dose adjustments (has not been studied) Hepatic: no dose adjustments (has not been studied)  Drug-drug interactions: none identified  Screening: TB test: completed  Hepatitis: completed   Monitoring: S/sx of infection: none CBC: monitored by her specialist S/sx of hypersensitivity: none S/sx of malignancy: none S/sx of heart failure: none  Other side effects: none  O:     Lab Results  Component Value Date   WBC 6.7 08/10/2022   HGB 13.2 08/10/2022   HCT 40.5 08/10/2022   MCV 85.5 08/10/2022   PLT 342.0 08/10/2022      Chemistry      Component Value Date/Time   NA 138 08/10/2022 0957   K 4.6 08/10/2022 0957   CL 106 08/10/2022 0957   CO2 27 08/10/2022 0957   BUN 12 08/10/2022 0957   CREATININE 0.63 08/10/2022 0957      Component Value Date/Time   CALCIUM 8.6 08/10/2022 0957   ALKPHOS 51 08/10/2022 0957   AST 12 08/10/2022 0957   ALT 12 08/10/2022 0957   BILITOT 0.7 08/10/2022 0957       A/P: 1. Medication review: Patient currently on Humira for RA. Reviewed the medication with the patient, including the following: Humira is a TNF blocking agent indicated for ankylosing spondylitis, Crohn's disease, Hidradenitis suppurativa, psoriatic arthritis, plaque psoriasis, ulcerative colitis, and uveitis. Patient educated on purpose, proper use and potential adverse effects of Humira. Possible adverse effects are increased risk  of infections, headache, and injection site reactions. There is the possibility of an increased risk of malignancy but it is not well understood if this increased risk is due to there medication or the disease state. There are rare cases of pancytopenia and aplastic anemia. For SubQ injection at separate sites in the thigh or lower abdomen (avoiding areas within 2 inches of navel); rotate injection sites. May leave at room temperature for ~15 to 30 minutes prior to use; do not remove cap or cover while allowing product to reach room temperature. Do not use if solution is discolored or contains particulate matter. Do not administer to skin which is red, tender, bruised, hard, or that has scars, stretch marks, or psoriasis plaques. Needle cap of the prefilled syringe or needle cover for the adalimumab pen may contain latex. Prefilled pens and syringes are available for use by patients and the full amount of the syringe should be injected (self-administration); the vial is intended for institutional use only. Vials do not contain a preservative; discard unused portion. No recommendations for any changes at this time.  Benard Halsted, PharmD, [facility], [place] [phone]

## 2022-09-13 ENCOUNTER — Other Ambulatory Visit (HOSPITAL_COMMUNITY): Payer: Self-pay

## 2022-09-13 MED ORDER — NITROFURANTOIN MONOHYD MACRO 100 MG PO CAPS
100.0000 mg | ORAL_CAPSULE | Freq: Two times a day (BID) | ORAL | 0 refills | Status: DC
  Filled 2022-09-13: qty 10, 5d supply, fill #0

## 2022-09-13 NOTE — Telephone Encounter (Signed)

## 2022-09-15 ENCOUNTER — Other Ambulatory Visit (HOSPITAL_COMMUNITY): Payer: Self-pay

## 2022-10-06 ENCOUNTER — Other Ambulatory Visit (HOSPITAL_COMMUNITY): Payer: Self-pay

## 2022-10-10 ENCOUNTER — Other Ambulatory Visit (HOSPITAL_COMMUNITY): Payer: Self-pay

## 2022-10-12 ENCOUNTER — Encounter: Payer: Self-pay | Admitting: Nurse Practitioner

## 2022-10-12 ENCOUNTER — Other Ambulatory Visit: Payer: Self-pay

## 2022-10-12 ENCOUNTER — Other Ambulatory Visit (HOSPITAL_COMMUNITY): Payer: Self-pay

## 2022-10-12 ENCOUNTER — Other Ambulatory Visit: Payer: Self-pay | Admitting: Nurse Practitioner

## 2022-10-12 MED ORDER — NITROFURANTOIN MONOHYD MACRO 100 MG PO CAPS: 100.0000 mg | ORAL_CAPSULE | Freq: Two times a day (BID) | ORAL | 0 refills | Status: DC

## 2022-10-13 ENCOUNTER — Other Ambulatory Visit: Payer: Self-pay

## 2022-10-21 ENCOUNTER — Other Ambulatory Visit (HOSPITAL_COMMUNITY): Payer: Self-pay

## 2022-10-24 ENCOUNTER — Encounter: Payer: Self-pay | Admitting: Nurse Practitioner

## 2022-10-24 DIAGNOSIS — J069 Acute upper respiratory infection, unspecified: Secondary | ICD-10-CM

## 2022-10-25 ENCOUNTER — Other Ambulatory Visit: Payer: Self-pay

## 2022-10-25 ENCOUNTER — Ambulatory Visit
Admission: EM | Admit: 2022-10-25 | Discharge: 2022-10-25 | Disposition: A | Discharge status: Home / Self Care | Payer: Commercial Managed Care - PPO | Attending: Family Medicine | Admitting: Family Medicine

## 2022-10-25 ENCOUNTER — Encounter: Payer: Self-pay | Admitting: Emergency Medicine

## 2022-10-25 ENCOUNTER — Ambulatory Visit: Payer: Commercial Managed Care - PPO

## 2022-10-25 ENCOUNTER — Ambulatory Visit (INDEPENDENT_AMBULATORY_CARE_PROVIDER_SITE_OTHER): Payer: Commercial Managed Care - PPO

## 2022-10-25 DIAGNOSIS — R059 Cough, unspecified: Secondary | ICD-10-CM | POA: Diagnosis not present

## 2022-10-25 DIAGNOSIS — Z1152 Encounter for screening for COVID-19: Secondary | ICD-10-CM | POA: Insufficient documentation

## 2022-10-25 DIAGNOSIS — J4521 Mild intermittent asthma with (acute) exacerbation: Secondary | ICD-10-CM | POA: Insufficient documentation

## 2022-10-25 DIAGNOSIS — J069 Acute upper respiratory infection, unspecified: Secondary | ICD-10-CM

## 2022-10-25 DIAGNOSIS — R0602 Shortness of breath: Secondary | ICD-10-CM | POA: Diagnosis not present

## 2022-10-25 MED ORDER — ALBUTEROL SULFATE HFA 108 (90 BASE) MCG/ACT IN AERS: 2.0000 | INHALATION_SPRAY | RESPIRATORY_TRACT | 0 refills | Status: DC | PRN

## 2022-10-25 MED ORDER — PREDNISONE 20 MG PO TABS: 40.0000 mg | ORAL_TABLET | Freq: Every day | ORAL | 0 refills | Status: DC

## 2022-10-25 MED ORDER — PREDNISONE 20 MG PO TABS: 40.0000 mg | ORAL_TABLET | Freq: Every day | ORAL | 0 refills | Status: AC

## 2022-10-25 NOTE — Discharge Instructions (Signed)
Your chest x-ray did not show any pneumonia or fluid  Albuterol inhaler--do 2 puffs every 4 hours as needed for shortness of breath or wheezing  Take prednisone 20 mg--2 daily for 5 days   You have been swabbed for COVID, and the test will result in the next 24 hours. Our staff will call you if positive. If the COVID test is positive, you should quarantine until you are fever free for 24 hours and you are starting to feel better, and then take added precautions for the next 5 days, such as physical distancing/wearing a mask and good hand hygiene/washing.

## 2022-10-25 NOTE — ED Triage Notes (Signed)
Provider triage  

## 2022-10-25 NOTE — ED Provider Notes (Signed)
[facility]    CSN: [id number] Arrival date & time: 10/25/22  1420      History   Chief Complaint Chief Complaint  Patient presents with   Cough    Seasonal allergies with dry cough that has caused shortness of breath - Entered by patient    HPI Daisy [name] is a 42 y.o. female.    Cough  Here for shortness of breath, cough, and now nasal drainage and postnasal drainage.  Her cough and shortness of breath began about 2 days ago.  No wheezing and she is never been known to have asthma. No fever or chills.  She has had a little soreness in her throat anteriorly but no pain on swallowing  She does take methotrexate and Humira for rheumatoid arthritis.  She does not take prednisone daily.   She actually did hear a little wheezing on the evening of April 6. Past Medical History:  Diagnosis Date   Allergy    Seasonal allergies   Anemia    Anxiety    Arthritis 11/2013   avascular arthritis left talon oa   Endometriosis    Migraines    Pelvic pain    PONV (postoperative nausea and vomiting)    Rheumatoid arthritis    Tightness of left heel cord 11/2013   Vitamin B12 deficiency    Vitamin D deficiency     Patient Active Problem List   Diagnosis Date Noted   Bartholin cyst 03/31/2022   Vitamin D deficiency 01/14/2022   Vitamin B12 deficiency 01/14/2022   Migraines 01/14/2022   Insomnia 01/14/2022   Morbid obesity 01/14/2022   Arthritis of foot, left 11/21/2013   KNEE PAIN, LEFT 03/31/2008   Rheumatoid arthritis 03/12/2008   PAIN IN JOINT, HAND 03/12/2008    Past Surgical History:  Procedure Laterality Date   ANKLE FUSION Left 11/21/2013   Procedure: LEFT TALONAVICULAR ARTHRODESIS;  Surgeon: Toni Arthurs, MD;  Location: [facility];  Service: Orthopedics;  Laterality: Left;   BILATERAL SALPINGECTOMY Bilateral 04/17/2014   Procedure: BILATERAL SALPINGECTOMY;  Surgeon: Kirkland Hun, MD;  Location: [facility];  Service: Gynecology;   Laterality: Bilateral;   CESAREAN SECTION  11/16/2002   DILATION AND CURETTAGE OF UTERUS     DILITATION & CURRETTAGE/HYSTROSCOPY WITH NOVASURE ABLATION N/A 04/17/2014   Procedure: DILATATION & CURETTAGE/HYSTEROSCOPY WITH NOVASURE ABLATION;  Surgeon: Kirkland Hun, MD;  Location: [facility];  Service: Gynecology;  Laterality: N/A;   GASTROCNEMIUS RECESSION Left 11/21/2013   Procedure: LEFT GASTROC RECESSION;  Surgeon: Toni Arthurs, MD;  Location: [facility];  Service: Orthopedics;  Laterality: Left;   LAPAROSCOPIC OVARIAN CYSTECTOMY Left 04/17/2014   Procedure: LAPAROSCOPIC OVARIAN CYSTECTOMY;  Surgeon: Kirkland Hun, MD;  Location: [facility];  Service: Gynecology;  Laterality: Left;   TUBAL LIGATION  07/27/2005   laparoscopic    OB History     Gravida  3   Para  3   Term  3   Preterm      AB      Living  3      SAB      IAB      Ectopic      Multiple      Live Births  3            Home Medications    Prior to Admission medications   Medication Sig Start Date End Date Taking? Authorizing Provider  albuterol (VENTOLIN HFA) 108 (90 Base) MCG/ACT inhaler Inhale 2 puffs  into the lungs every 4 (four) hours as needed for wheezing or shortness of breath. 10/25/22  Yes Zenia ResidesBanister, Bland Rudzinski K, MD  predniSONE (DELTASONE) 20 MG tablet Take 2 tablets (40 mg total) by mouth daily with breakfast for 5 days. 10/25/22 10/30/22 Yes Jilliane Kazanjian, Janace ArisPamela K, MD  Adalimumab (HUMIRA, 2 PEN,) 40 MG/0.8ML PNKT Inject 0.8 mLs (40 mg total) into the skin every 14 days. 09/12/22   Quentin AngstJegede, Olugbemiga E, MD  cyanocobalamin (,VITAMIN B-12,) 1000 MCG/ML injection Inject 1000mcg (1ml) as directed every 28 days for 5 doses 04/15/21     diclofenac Sodium (VOLTAREN) 1 % GEL Apply 2-4 grams to the affected skin areas four times a day.  Do not exceed a  total dose of 32 g per day over all affected joints. Use the enclosed dosing card to measure the appropriate dose. 08/11/21     ergocalciferol (VITAMIN D2) 1.25  MG (50000 UT) capsule Take 1 capsule by mouth once a week. 08/10/22   McElwee, Lauren A, NP  methotrexate (RHEUMATREX) 2.5 MG tablet Take 6 tablets (15 mg total) by mouth once a week. 09/09/22     norethindrone (AYGESTIN) 5 MG tablet TAKE 1 AND 1/2 TABLETS BY MOUTH DAILY. 04/20/22   McElwee, Lauren A, NP  tirzepatide (ZEPBOUND) 2.5 MG/0.5ML Pen Inject 2.5 mg into the skin once a week. 08/10/22   McElwee, Lauren A, NP  topiramate (TOPAMAX) 25 MG tablet Take 1 tablet (25 mg total) by mouth daily. 08/10/22   McElwee, Lauren A, NP  triamcinolone cream (KENALOG) 0.1 % Apply 1 Application topically 2 (two) times daily. 05/05/22   McElwee, Lauren A, NP  valACYclovir (VALTREX) 1000 MG tablet Take 1 tablet (1,000 mg total) by mouth daily as needed (OUTBREAK). 12/15/21     zolpidem (AMBIEN) 10 MG tablet Take 1 tablet (10 mg total) by mouth nightly as needed for sleep. 10/14/21       Family History Family History  Problem Relation Age of Onset   COPD Mother    Cancer Mother        lung   Cancer Father        throat/mouth   Asthma Daughter    ADD / ADHD Daughter    Asthma Son    Cancer Paternal Uncle        prostate   Cancer Paternal Uncle    Breast cancer Neg Hx     Social History Social History   Tobacco Use   Smoking status: Former    Packs/day: 1.00    Years: 4.00    Additional pack years: 0.00    Total pack years: 4.00    Types: Cigarettes    Quit date: 2004    Years since quitting: 20.2   Smokeless tobacco: Never   Tobacco comments:    quit smoking 2010  Vaping Use   Vaping Use: Never used  Substance Use Topics   Alcohol use: Yes    Alcohol/week: 4.0 standard drinks of alcohol    Types: 2 Glasses of wine, 2 Standard drinks or equivalent per week    Comment: social   Drug use: No     Allergies   Codeine, Tomato, Percocet [oxycodone-acetaminophen], and Tramadol   Review of Systems Review of Systems  Respiratory:  Positive for cough.      Physical Exam Triage Vital  Signs ED Triage Vitals [10/25/22 1513]  Enc Vitals Group     BP 117/75     Pulse Rate 84     Resp  15     Temp 97.7 F (36.5 C)     Temp Source Oral     SpO2 98 %     Weight      Height      Head Circumference      Peak Flow      Pain Score      Pain Loc      Pain Edu?      Excl. in GC?    No data found.  Updated Vital Signs BP 117/75 (BP Location: Left Arm)   Pulse 84   Temp 97.7 F (36.5 C) (Oral)   Resp 15   SpO2 98%   Visual Acuity Right Eye Distance:   Left Eye Distance:   Bilateral Distance:    Right Eye Near:   Left Eye Near:    Bilateral Near:     Physical Exam Vitals reviewed.  Constitutional:      General: She is not in acute distress.    Appearance: She is not ill-appearing, toxic-appearing or diaphoretic.  HENT:     Right Ear: Tympanic membrane and ear canal normal.     Left Ear: Tympanic membrane and ear canal normal.     Nose: Nose normal.     Mouth/Throat:     Mouth: Mucous membranes are moist.     Pharynx: No oropharyngeal exudate or posterior oropharyngeal erythema.  Eyes:     Extraocular Movements: Extraocular movements intact.     Conjunctiva/sclera: Conjunctivae normal.     Pupils: Pupils are equal, round, and reactive to light.  Cardiovascular:     Rate and Rhythm: Normal rate and regular rhythm.     Heart sounds: No murmur heard. Pulmonary:     Effort: Pulmonary effort is normal. No respiratory distress.     Breath sounds: No stridor. No wheezing, rhonchi or rales.  Musculoskeletal:     Cervical back: Neck supple.  Lymphadenopathy:     Cervical: No cervical adenopathy.  Skin:    Capillary Refill: Capillary refill takes less than 2 seconds.     Coloration: Skin is not jaundiced or pale.  Neurological:     General: No focal deficit present.     Mental Status: She is alert and oriented to person, place, and time.  Psychiatric:        Behavior: Behavior normal.      UC Treatments / Results  Labs (all labs ordered are  listed, but only abnormal results are displayed) Labs Reviewed - No data to display  EKG   Radiology DG Chest 2 View  Result Date: 10/25/2022 CLINICAL DATA:  Shortness of breath and cough. EXAM: CHEST - 2 VIEW COMPARISON:  None Available. FINDINGS: The heart size and mediastinal contours are within normal limits. Lungs are clear. No pneumothorax or pleural effusion. No acute finding in the visualized skeleton. IMPRESSION: No acute cardiopulmonary finding. Electronically Signed   By: Emmaline Kluver M.D.   On: 10/25/2022 16:30    Procedures Procedures (including critical care time)  Medications Ordered in UC Medications - No data to display  Initial Impression / Assessment and Plan / UC Course  I have reviewed the triage vital signs and the nursing notes.  Pertinent labs & imaging results that were available during my care of the patient were reviewed by me and considered in my medical decision making (see chart for details).        Chest x-ray is clear and does not show any infiltrate or fluid.  They  are having her little wheezing, I am going to treat for possible mild intermittent asthma exacerbation with some albuterol and prednisone.  COVID swab is done and if positive she would be a candidate for Paxlovid with her being on immunosuppressive medications.  Her last EGFR in January of this year was 109. Final Clinical Impressions(s) / UC Diagnoses   Final diagnoses:  Acute upper respiratory infection  Mild intermittent asthma with (acute) exacerbation     Discharge Instructions      Your chest x-ray did not show any pneumonia or fluid  Albuterol inhaler--do 2 puffs every 4 hours as needed for shortness of breath or wheezing  Take prednisone 20 mg--2 daily for 5 days   You have been swabbed for COVID, and the test will result in the next 24 hours. Our staff will call you if positive. If the COVID test is positive, you should quarantine until you are fever free for 24  hours and you are starting to feel better, and then take added precautions for the next 5 days, such as physical distancing/wearing a mask and good hand hygiene/washing.       ED Prescriptions     Medication Sig Dispense Auth. Provider   albuterol (VENTOLIN HFA) 108 (90 Base) MCG/ACT inhaler Inhale 2 puffs into the lungs every 4 (four) hours as needed for wheezing or shortness of breath. 1 each Zenia Resides, MD   predniSONE (DELTASONE) 20 MG tablet Take 2 tablets (40 mg total) by mouth daily with breakfast for 5 days. 10 tablet Marlinda Mike Janace Aris, MD      PDMP not reviewed this encounter.   Zenia Resides, MD 10/25/22 [phone]

## 2022-10-26 ENCOUNTER — Ambulatory Visit: Payer: Commercial Managed Care - PPO | Admitting: Nurse Practitioner

## 2022-10-26 LAB — SARS CORONAVIRUS 2 (TAT 6-24 HRS): SARS Coronavirus 2: NEGATIVE

## 2022-10-26 MED ORDER — IPRATROPIUM BROMIDE 0.06 % NA SOLN: 2.0000 | Freq: Four times a day (QID) | NASAL | 12 refills | Status: DC

## 2022-10-26 MED ORDER — PROMETHAZINE-DM 6.25-15 MG/5ML PO SYRP
2.5000 mL | ORAL_SOLUTION | Freq: Four times a day (QID) | ORAL | 0 refills | Status: DC | PRN
Start: 2022-10-26 — End: 2022-12-13

## 2022-11-03 ENCOUNTER — Ambulatory Visit: Payer: Commercial Managed Care - PPO | Admitting: Nurse Practitioner

## 2022-11-10 ENCOUNTER — Other Ambulatory Visit (HOSPITAL_COMMUNITY): Payer: Self-pay

## 2022-11-12 ENCOUNTER — Other Ambulatory Visit (HOSPITAL_COMMUNITY): Payer: Self-pay

## 2022-11-14 ENCOUNTER — Other Ambulatory Visit (HOSPITAL_COMMUNITY): Payer: Self-pay

## 2022-11-16 ENCOUNTER — Other Ambulatory Visit (HOSPITAL_COMMUNITY): Payer: Self-pay

## 2022-11-21 ENCOUNTER — Other Ambulatory Visit: Payer: Self-pay | Admitting: Family Medicine

## 2022-11-22 ENCOUNTER — Other Ambulatory Visit (HOSPITAL_COMMUNITY): Payer: Self-pay

## 2022-12-02 ENCOUNTER — Other Ambulatory Visit (HOSPITAL_COMMUNITY): Payer: Self-pay

## 2022-12-13 ENCOUNTER — Encounter: Payer: Self-pay | Admitting: Nurse Practitioner

## 2022-12-13 ENCOUNTER — Ambulatory Visit: Payer: Commercial Managed Care - PPO | Admitting: Nurse Practitioner

## 2022-12-13 ENCOUNTER — Other Ambulatory Visit (HOSPITAL_COMMUNITY): Payer: Self-pay

## 2022-12-13 VITALS — BP 118/88 | HR 81 | Temp 98.1°F | Ht 63.0 in | Wt 241.0 lb

## 2022-12-13 DIAGNOSIS — M069 Rheumatoid arthritis, unspecified: Secondary | ICD-10-CM | POA: Diagnosis not present

## 2022-12-13 MED ORDER — CONTRAVE 8-90 MG PO TB12
ORAL_TABLET | ORAL | 0 refills | Status: DC
  Filled 2022-12-13: qty 120, 64d supply, fill #0
  Filled 2022-12-19 – 2023-01-03 (×2): qty 120, 30d supply, fill #0
  Filled 2023-01-03: qty 120, 60d supply, fill #0

## 2022-12-13 NOTE — Assessment & Plan Note (Signed)
Chronic, stable.  She follows with rheumatology and is taking Humira injections and methotrexate.  Continue collaboration recommendations from rheumatology

## 2022-12-13 NOTE — Progress Notes (Signed)
Established Patient Office Visit  Subjective   Patient ID: Daisy [name], female    DOB: 20-May-1981  Age: 42 y.o. MRN: [id number]  Chief Complaint  Patient presents with   Weight Managment    Discuss options    HPI  Daisy [name] is here to follow-up on weight management and rheumatoid arthritis  She was taking the Zepbound injections until her health insurance stopped covering them.  Her last injection was about a month ago.  She is interested in discussing other medications to help with weight loss.  She has tried phentermine and Topamax (not as qsymia) and Saxenda in the past.  She states the phentermine caused her to feel dehydrated and have dry skin and dry eyes.  She has still been trying to adjust her nutrition and is eating 1 meal a day.  She has not been exercising because her pain in her knee will flareup.  She is still taking Humira and methotrexate for her rheumatoid arthritis. She denies chest pain and shortness of breath.     ROS See pertinent positives and negatives per HPI.    Objective:     BP 118/88 (BP Location: Left Arm)   Pulse 81   Temp 98.1 F (36.7 C)   Ht 5\' 3"  (1.6 m)   Wt 241 lb (109.3 kg)   SpO2 99%   BMI 42.69 kg/m  BP Readings from Last 3 Encounters:  12/13/22 118/88  10/25/22 117/75  08/10/22 126/84   Wt Readings from Last 3 Encounters:  12/13/22 241 lb (109.3 kg)  08/10/22 240 lb 6.4 oz (109 kg)  07/20/22 236 lb (107 kg)      Physical Exam Vitals and nursing note reviewed.  Constitutional:      General: She is not in acute distress.    Appearance: Normal appearance. She is obese.  HENT:     Head: Normocephalic.  Eyes:     Conjunctiva/sclera: Conjunctivae normal.  Cardiovascular:     Rate and Rhythm: Normal rate and regular rhythm.     Pulses: Normal pulses.     Heart sounds: Normal heart sounds.  Pulmonary:     Effort: Pulmonary effort is normal.     Breath sounds: Normal breath sounds.  Musculoskeletal:     Cervical  back: Normal range of motion.  Skin:    General: Skin is warm.  Neurological:     General: No focal deficit present.     Mental Status: She is alert and oriented to person, place, and time.  Psychiatric:        Mood and Affect: Mood normal.        Behavior: Behavior normal.        Thought Content: Thought content normal.        Judgment: Judgment normal.    The 10-year ASCVD risk score (Arnett DK, et al., 2019) is: 0.6%    Assessment & Plan:   Problem List Items Addressed This Visit       Musculoskeletal and Integument   Rheumatoid arthritis (HCC) - Primary    Chronic, stable.  She follows with rheumatology and is taking Humira injections and methotrexate.  Continue collaboration recommendations from rheumatology        Other   Morbid obesity (HCC)    BMI 42.6.  She states that her insurance stopped covering the Zepbound injections.  She would like to discuss other weight loss medications available.  She has tried Qsymia in the past and this caused her  to have dry skin and dry eyes.  Will have her start Contrave 1 tablet daily for 7 days and then increase to 1 tablet twice a day.  Discussed possible side effects.  Handout given on healthy eating.  Discussed trying to increase exercise as able.  Follow-up in 4 weeks or sooner with concerns.      Relevant Medications   Naltrexone-buPROPion HCl ER (CONTRAVE) 8-90 MG TB12    Return in about 4 weeks (around 01/10/2023) for weight management .    Gerre Scull, NP

## 2022-12-13 NOTE — Assessment & Plan Note (Signed)
BMI 42.6.  She states that her insurance stopped covering the Zepbound injections.  She would like to discuss other weight loss medications available.  She has tried Qsymia in the past and this caused her to have dry skin and dry eyes.  Will have her start Contrave 1 tablet daily for 7 days and then increase to 1 tablet twice a day.  Discussed possible side effects.  Handout given on healthy eating.  Discussed trying to increase exercise as able.  Follow-up in 4 weeks or sooner with concerns.

## 2022-12-13 NOTE — Patient Instructions (Signed)
It was great to see you!  Start contrave 1 tablet in the morning for 7 days then increase to 1 tablet twice a day. Take the second tablet around 1-2pm.   Let's follow-up in 4 weeks, sooner if you have concerns.  If a referral was placed today, you will be contacted for an appointment. Please note that routine referrals can sometimes take up to 3-4 weeks to process. Please call our office if you haven't heard anything after this time frame.  Take care,  Rodman Pickle, NP

## 2022-12-14 ENCOUNTER — Other Ambulatory Visit (HOSPITAL_COMMUNITY): Payer: Self-pay

## 2022-12-19 ENCOUNTER — Other Ambulatory Visit (HOSPITAL_COMMUNITY): Payer: Self-pay

## 2022-12-20 ENCOUNTER — Other Ambulatory Visit (HOSPITAL_COMMUNITY): Payer: Self-pay

## 2022-12-20 ENCOUNTER — Telehealth: Payer: Self-pay

## 2022-12-20 NOTE — Telephone Encounter (Signed)
Patient notified in office.

## 2022-12-20 NOTE — Telephone Encounter (Signed)
Prior Auth is needed on Contrave 8-90mg 

## 2022-12-21 ENCOUNTER — Other Ambulatory Visit (HOSPITAL_COMMUNITY): Payer: Self-pay

## 2022-12-27 ENCOUNTER — Other Ambulatory Visit (HOSPITAL_COMMUNITY): Payer: Self-pay

## 2023-01-03 ENCOUNTER — Other Ambulatory Visit (HOSPITAL_COMMUNITY): Payer: Self-pay

## 2023-01-03 NOTE — Telephone Encounter (Signed)
I called [name] regarding Rx of Contrave that pharmacy tech told patient that it needed Prior approval. I told the tech that prior auth was done already and approved on 12/20/22 and she ran it and it went through with a cost of $24.97. Patient notified in office.

## 2023-01-23 ENCOUNTER — Other Ambulatory Visit (HOSPITAL_COMMUNITY): Payer: Self-pay

## 2023-01-26 ENCOUNTER — Other Ambulatory Visit: Payer: Self-pay

## 2023-01-26 ENCOUNTER — Other Ambulatory Visit: Payer: Self-pay | Admitting: Internal Medicine

## 2023-01-27 ENCOUNTER — Other Ambulatory Visit (HOSPITAL_COMMUNITY): Payer: Self-pay

## 2023-01-30 ENCOUNTER — Encounter (HOSPITAL_COMMUNITY): Payer: Self-pay

## 2023-01-30 ENCOUNTER — Other Ambulatory Visit (HOSPITAL_COMMUNITY): Payer: Self-pay

## 2023-02-06 ENCOUNTER — Other Ambulatory Visit (HOSPITAL_COMMUNITY): Payer: Self-pay

## 2023-02-10 ENCOUNTER — Ambulatory Visit: Payer: Commercial Managed Care - PPO | Admitting: Nurse Practitioner

## 2023-02-10 ENCOUNTER — Encounter: Payer: Self-pay | Admitting: Nurse Practitioner

## 2023-02-10 ENCOUNTER — Other Ambulatory Visit (HOSPITAL_COMMUNITY): Payer: Self-pay

## 2023-02-10 VITALS — BP 116/80 | HR 68 | Temp 97.5°F | Ht 63.0 in | Wt 241.0 lb

## 2023-02-10 DIAGNOSIS — M25562 Pain in left knee: Secondary | ICD-10-CM

## 2023-02-10 DIAGNOSIS — E559 Vitamin D deficiency, unspecified: Secondary | ICD-10-CM

## 2023-02-10 DIAGNOSIS — Z6841 Body Mass Index (BMI) 40.0 and over, adult: Secondary | ICD-10-CM

## 2023-02-10 LAB — VITAMIN D 25 HYDROXY (VIT D DEFICIENCY, FRACTURES): VITD: 16.33 ng/mL — ABNORMAL LOW (ref 30.00–100.00)

## 2023-02-10 MED ORDER — ERGOCALCIFEROL 1.25 MG (50000 UT) PO CAPS
50000.0000 [IU] | ORAL_CAPSULE | ORAL | 0 refills | Status: DC
  Filled 2023-02-10 – 2023-03-29 (×2): qty 12, 84d supply, fill #0

## 2023-02-10 MED ORDER — CONTRAVE 8-90 MG PO TB12
ORAL_TABLET | ORAL | 1 refills | Status: DC
  Filled 2023-02-10: qty 120, fill #0

## 2023-02-10 NOTE — Addendum Note (Signed)
Addended by: Rodman Pickle A on: 02/10/2023 02:07 PM   Modules accepted: Orders

## 2023-02-10 NOTE — Patient Instructions (Signed)
It was great to see you!  We are checking your vitamin D levels today  Increase your contrave to 2 tablets in the morning and 1 at night. Then in 7 days, you can increase again if needed to 2 tablets in the morning and 2 at night.   Let's follow-up in 3 months, sooner if you have concerns.  If a referral was placed today, you will be contacted for an appointment. Please note that routine referrals can sometimes take up to 3-4 weeks to process. Please call our office if you haven't heard anything after this time frame.  Take care,  Rodman Pickle, NP

## 2023-02-10 NOTE — Assessment & Plan Note (Signed)
BMI 42.6.  She had lost 6 pounds initially when starting the Contrave, however she gained the weight back.  She has been doing intermittent fasting and eating 1 meal a day.  She been having hard time exercising due to her left knee pain.  Will increase her Contrave to 2 tablets in the morning and 1 in the afternoon, then after 7 days she can increase again to 2 tablets twice a day.  Follow-up in 3 months or sooner with concerns.

## 2023-02-10 NOTE — Assessment & Plan Note (Signed)
She has chronic pain in her left knee due to arthritis.  She has an appointment coming up with the orthopedic to get a knee injection.  Encouraged her to keep this appointment.  She can continue to wear her knee brace during the day and take ibuprofen as needed for pain.

## 2023-02-10 NOTE — Progress Notes (Signed)
Established Patient Office Visit  Subjective   Patient ID: Daisy [name], female    DOB: [birth year]  Age: 42 y.o. MRN: [id number]  Chief Complaint  Patient presents with   Management of Weight    Follow up    HPI  Daisy [name] is here to follow-up on weight management and low vitamin D.   She states that she is tolerating the Contrave well.  She did have some trouble with acne a week ago, however this has resolved.  She states that she feels like she may need to increase the dose.  She had lost 6 pounds while for starting it, but she gained that weight back.  She has been trying to watch what she is eating and she typically eats 1 meal a day and does intermittent fasting.  She has not been exercising because she has been having some knee pain.  She has been having chronic left knee pain that has flared up recently.  She has been taking ibuprofen which has not been helping.  She feels like her Baker's cyst is flaring up as well.  She has wore her knee brace yesterday which did help some with the pain.  She has an appointment with Ortho to get an injection done.    ROS See pertinent positives and negatives per HPI.    Objective:     BP 116/80 (BP Location: Left Arm)   Pulse 68   Temp (!) 97.5 F (36.4 C)   Ht 5\' 3"  (1.6 m)   Wt 241 lb (109.3 kg)   SpO2 99%   BMI 42.69 kg/m  BP Readings from Last 3 Encounters:  02/10/23 116/80  12/13/22 118/88  10/25/22 117/75   Wt Readings from Last 3 Encounters:  02/10/23 241 lb (109.3 kg)  12/13/22 241 lb (109.3 kg)  08/10/22 240 lb 6.4 oz (109 kg)      Physical Exam Vitals and nursing note reviewed.  Constitutional:      General: She is not in acute distress.    Appearance: Normal appearance.  HENT:     Head: Normocephalic.  Eyes:     Conjunctiva/sclera: Conjunctivae normal.  Cardiovascular:     Rate and Rhythm: Normal rate and regular rhythm.     Pulses: Normal pulses.     Heart sounds: Normal heart sounds.   Pulmonary:     Effort: Pulmonary effort is normal.     Breath sounds: Normal breath sounds.  Musculoskeletal:        General: Tenderness present. No swelling.     Cervical back: Normal range of motion.  Skin:    General: Skin is warm.  Neurological:     General: No focal deficit present.     Mental Status: She is alert and oriented to person, place, and time.  Psychiatric:        Mood and Affect: Mood normal.        Behavior: Behavior normal.        Thought Content: Thought content normal.        Judgment: Judgment normal.    The 10-year ASCVD risk score (Arnett DK, et al., 2019) is: 0.6%    Assessment & Plan:   Problem List Items Addressed This Visit       Other   KNEE PAIN, LEFT    She has chronic pain in her left knee due to arthritis.  She has an appointment coming up with the orthopedic to get a knee injection.  Encouraged her to keep this appointment.  She can continue to wear her knee brace during the day and take ibuprofen as needed for pain.      Vitamin D deficiency - Primary    She is currently taking vitamin D 2000 units daily.  Will check vitamin D levels and adjust regimen based on results.      Relevant Orders   VITAMIN D 25 Hydroxy (Vit-D Deficiency, Fractures)   Morbid obesity (HCC)    BMI 42.6.  She had lost 6 pounds initially when starting the Contrave, however she gained the weight back.  She has been doing intermittent fasting and eating 1 meal a day.  She been having hard time exercising due to her left knee pain.  Will increase her Contrave to 2 tablets in the morning and 1 in the afternoon, then after 7 days she can increase again to 2 tablets twice a day.  Follow-up in 3 months or sooner with concerns.      Relevant Medications   Naltrexone-buPROPion HCl ER (CONTRAVE) 8-90 MG TB12    Return in about 3 months (around 05/13/2023) for weight management .    Gerre Scull, NP

## 2023-02-10 NOTE — Assessment & Plan Note (Signed)
She is currently taking vitamin D 2000 units daily.  Will check vitamin D levels and adjust regimen based on results.

## 2023-02-17 ENCOUNTER — Other Ambulatory Visit (HOSPITAL_COMMUNITY): Payer: Self-pay

## 2023-02-22 ENCOUNTER — Other Ambulatory Visit: Payer: Self-pay

## 2023-02-24 DIAGNOSIS — M1712 Unilateral primary osteoarthritis, left knee: Secondary | ICD-10-CM | POA: Diagnosis not present

## 2023-02-28 ENCOUNTER — Other Ambulatory Visit (HOSPITAL_COMMUNITY): Payer: Self-pay

## 2023-03-08 ENCOUNTER — Other Ambulatory Visit: Payer: Self-pay

## 2023-03-21 ENCOUNTER — Encounter: Payer: Self-pay | Admitting: Nurse Practitioner

## 2023-03-22 ENCOUNTER — Other Ambulatory Visit (HOSPITAL_COMMUNITY): Payer: Self-pay

## 2023-03-22 MED ORDER — LOMAIRA 8 MG PO TABS
8.0000 mg | ORAL_TABLET | Freq: Two times a day (BID) | ORAL | 0 refills | Status: DC
  Filled 2023-03-22: qty 60, 30d supply, fill #0

## 2023-03-22 MED ORDER — TOPIRAMATE 25 MG PO TABS
25.0000 mg | ORAL_TABLET | Freq: Every day | ORAL | 1 refills | Status: DC
  Filled 2023-03-22: qty 90, 90d supply, fill #0

## 2023-03-25 ENCOUNTER — Other Ambulatory Visit (HOSPITAL_COMMUNITY): Payer: Self-pay

## 2023-03-25 MED ORDER — HUMIRA (2 PEN) 40 MG/0.8ML ~~LOC~~ AJKT
40.0000 mg | AUTO-INJECTOR | SUBCUTANEOUS | 2 refills | Status: DC
  Filled 2023-03-25: qty 1.6, 28d supply, fill #0

## 2023-03-29 ENCOUNTER — Other Ambulatory Visit: Payer: Self-pay

## 2023-03-30 ENCOUNTER — Other Ambulatory Visit (HOSPITAL_COMMUNITY): Payer: Self-pay

## 2023-04-08 ENCOUNTER — Encounter (HOSPITAL_COMMUNITY): Payer: Self-pay

## 2023-04-12 ENCOUNTER — Other Ambulatory Visit: Payer: Self-pay | Admitting: Pharmacist

## 2023-04-12 ENCOUNTER — Other Ambulatory Visit: Payer: Self-pay

## 2023-04-12 MED ORDER — HUMIRA (2 PEN) 40 MG/0.8ML ~~LOC~~ AJKT
40.0000 mg | AUTO-INJECTOR | SUBCUTANEOUS | 2 refills | Status: AC
  Filled 2023-04-12: qty 1.6, 28d supply, fill #0
  Filled 2023-04-12: qty 2, 28d supply, fill #0
  Filled 2023-05-05: qty 1.6, 28d supply, fill #0

## 2023-04-12 NOTE — Progress Notes (Signed)
Medication: Humira Able to fill? Yes Prior authorization required? Yes Co-pay before assistance: To be determined

## 2023-04-12 NOTE — Progress Notes (Signed)
Specialty Pharmacy Initial Fill Coordination Note  Daisy [name] is a 42 y.o. female contacted today regarding refills of specialty medication(s) Adalimumab .  Patient requested [name] at [facility] at [place]  on 04/13/23   Medication will be filled on 9/25.   Patient is aware of $0 copayment. Patient has Humira copay card.

## 2023-04-12 NOTE — Progress Notes (Signed)
Pharmacy Patient Advocate Encounter  Received notification from [facility] that Prior Authorization for Humira has been APPROVED from 04/12/23 to 04/11/24   PA #/Case ID/Reference #: [id number] Key [id number]

## 2023-04-18 ENCOUNTER — Other Ambulatory Visit: Payer: Self-pay

## 2023-04-19 ENCOUNTER — Other Ambulatory Visit: Payer: Self-pay

## 2023-04-19 ENCOUNTER — Encounter (HOSPITAL_COMMUNITY): Payer: Self-pay

## 2023-04-19 ENCOUNTER — Other Ambulatory Visit: Payer: Self-pay | Admitting: Pharmacist

## 2023-04-19 NOTE — Progress Notes (Signed)
Please see documentation from 09/12/22 for medication review and counseling.  Butch Penny, PharmD, Patsy [place], CPP Clinical Pharmacist [facility] [phone]

## 2023-05-02 ENCOUNTER — Other Ambulatory Visit: Payer: Self-pay | Admitting: Nurse Practitioner

## 2023-05-03 ENCOUNTER — Other Ambulatory Visit: Payer: Self-pay

## 2023-05-03 ENCOUNTER — Other Ambulatory Visit (HOSPITAL_COMMUNITY): Payer: Self-pay

## 2023-05-03 MED ORDER — ERGOCALCIFEROL 1.25 MG (50000 UT) PO CAPS
50000.0000 [IU] | ORAL_CAPSULE | ORAL | 0 refills | Status: DC
  Filled 2023-05-03: qty 12, 84d supply, fill #0

## 2023-05-03 MED ORDER — LOMAIRA 8 MG PO TABS
8.0000 mg | ORAL_TABLET | Freq: Two times a day (BID) | ORAL | 0 refills | Status: DC
  Filled 2023-05-03: qty 60, 30d supply, fill #0

## 2023-05-03 NOTE — Telephone Encounter (Signed)
Requesting: ergocalciferol (VITAMIN D2) 1.25 MG (50000 UT) capsule, Phentermine HCl (LOMAIRA) 8 MG TABS  Last Visit: 02/10/2023 Next Visit: 06/19/2023 Last Refill: 02/10/2023, 03/22/2023  Please Advise

## 2023-05-03 NOTE — Progress Notes (Signed)
Medication was never picked up from 9/25 at [facility] & has been Return to [facility] on 05/03/2023.

## 2023-05-04 ENCOUNTER — Other Ambulatory Visit (HOSPITAL_COMMUNITY): Payer: Self-pay

## 2023-05-05 ENCOUNTER — Other Ambulatory Visit: Payer: Self-pay

## 2023-05-05 ENCOUNTER — Encounter (HOSPITAL_COMMUNITY): Payer: Self-pay

## 2023-05-05 ENCOUNTER — Other Ambulatory Visit (HOSPITAL_COMMUNITY): Payer: Self-pay

## 2023-05-05 NOTE — Progress Notes (Signed)
Specialty Pharmacy Refill Coordination Note  Daisy [name] is a 42 y.o. female contacted today regarding refills of specialty medication(s) No data recorded  Patient requested Pickup at [facility] at [place] date: 05/05/23   Medication will be filled on 05/05/23.

## 2023-05-08 ENCOUNTER — Other Ambulatory Visit (HOSPITAL_COMMUNITY): Payer: Self-pay

## 2023-05-15 ENCOUNTER — Ambulatory Visit: Payer: Commercial Managed Care - PPO | Admitting: Nurse Practitioner

## 2023-05-23 ENCOUNTER — Other Ambulatory Visit (HOSPITAL_COMMUNITY): Payer: Self-pay

## 2023-05-26 ENCOUNTER — Encounter (HOSPITAL_COMMUNITY): Payer: Self-pay

## 2023-05-26 ENCOUNTER — Other Ambulatory Visit (HOSPITAL_COMMUNITY): Payer: Self-pay

## 2023-05-29 ENCOUNTER — Other Ambulatory Visit: Payer: Self-pay

## 2023-06-19 ENCOUNTER — Other Ambulatory Visit (HOSPITAL_COMMUNITY): Payer: Self-pay | Admitting: Pharmacy Technician

## 2023-06-19 ENCOUNTER — Other Ambulatory Visit (HOSPITAL_COMMUNITY): Payer: Self-pay

## 2023-06-19 ENCOUNTER — Telehealth: Payer: Self-pay

## 2023-06-19 ENCOUNTER — Encounter: Payer: Self-pay | Admitting: Nurse Practitioner

## 2023-06-19 ENCOUNTER — Ambulatory Visit (INDEPENDENT_AMBULATORY_CARE_PROVIDER_SITE_OTHER): Payer: PRIVATE HEALTH INSURANCE | Admitting: Nurse Practitioner

## 2023-06-19 VITALS — BP 116/82 | HR 86 | Temp 97.6°F | Ht 63.0 in | Wt 236.0 lb

## 2023-06-19 DIAGNOSIS — E559 Vitamin D deficiency, unspecified: Secondary | ICD-10-CM

## 2023-06-19 MED ORDER — ZEPBOUND 2.5 MG/0.5ML ~~LOC~~ SOAJ: 2.5000 mg | SUBCUTANEOUS | 0 refills | Status: DC

## 2023-06-19 MED ORDER — ERGOCALCIFEROL 1.25 MG (50000 UT) PO CAPS: 50000.0000 [IU] | ORAL_CAPSULE | ORAL | 0 refills | Status: DC

## 2023-06-19 NOTE — Progress Notes (Signed)
Disenrolled; Patient no longer has [facility] Ins and have to use a new pharmacy.

## 2023-06-19 NOTE — Progress Notes (Signed)
   Established Patient Office Visit  Subjective   Patient ID: PRETTY DILEO, female    DOB: January 01, 1981  Age: 42 y.o. MRN: [id number]  Chief Complaint  Patient presents with   Weight Management    Follow up    HPI  Daisy [name] is here to follow-up on vitamin D deficiency and weight management.  She states that she is doing well, however the [place] has stopped with her appetite suppression.  She is still trying to eat smaller portion sizes and she exercises on lunch during work and also during downtime's.  She denies chest pain and shortness of breath.  She states that her new health insurance covers Zepbound and she did well with this before and would like to restart it.  She also needs a refill of her vitamin D.  She states that she is having some fatigue still.    ROS See pertinent positives and negatives per HPI.    Objective:     BP 116/82 (BP Location: Left Arm)   Pulse 86   Temp 97.6 F (36.4 C)   Ht 5\' 3"  (1.6 m)   Wt 236 lb (107 kg)   SpO2 98%   BMI 41.81 kg/m  Wt Readings from Last 3 Encounters:  06/19/23 236 lb (107 kg)  02/10/23 241 lb (109.3 kg)  12/13/22 241 lb (109.3 kg)      Physical Exam Vitals and nursing note reviewed.  Constitutional:      General: She is not in acute distress.    Appearance: Normal appearance.  HENT:     Head: Normocephalic.  Eyes:     Conjunctiva/sclera: Conjunctivae normal.  Cardiovascular:     Rate and Rhythm: Normal rate and regular rhythm.     Pulses: Normal pulses.     Heart sounds: Normal heart sounds.  Pulmonary:     Effort: Pulmonary effort is normal.     Breath sounds: Normal breath sounds.  Musculoskeletal:     Cervical back: Normal range of motion.  Skin:    General: Skin is warm.  Neurological:     General: No focal deficit present.     Mental Status: She is alert and oriented to person, place, and time.  Psychiatric:        Mood and Affect: Mood normal.        Behavior: Behavior normal.         Thought Content: Thought content normal.        Judgment: Judgment normal.    The 10-year ASCVD risk score (Arnett DK, et al., 2019) is: 0.6%    Assessment & Plan:   Problem List Items Addressed This Visit       Other   Vitamin D deficiency - Primary    Continue vitamin D 50,000 units weekly.  Recheck labs next visit      Morbid obesity (HCC)    She is lost 5 pounds since her last visit, congratulated her on this.  Her new insurance covers Zepbound and she would like to switch since the [place] is not decreasing her appetite anymore.  Will have her start Zepbound 2.5 mg injection weekly.  She was on this before and did well, until her insurance stopped covering it.  Follow-up in 3 months or sooner with concerns.      Relevant Medications   tirzepatide (ZEPBOUND) 2.5 MG/0.5ML Pen    Return in about 3 months (around 09/17/2023) for CPE.    Gerre Scull, NP

## 2023-06-19 NOTE — Assessment & Plan Note (Signed)
Continue vitamin D 50,000 units weekly.  Recheck labs next visit

## 2023-06-19 NOTE — Telephone Encounter (Signed)
Pharmacy Patient Advocate Encounter   Received notification from Pt Calls Messages that prior authorization for Zepbound 2.5mg /0.24ml is required/requested.   Insurance verification completed.   The patient is insured through [facility] .   Per test claim: PA required; PA submitted to above mentioned insurance via CoverMyMeds Key/confirmation #/EOC  [id number] Status is pending

## 2023-06-19 NOTE — Telephone Encounter (Signed)
Patient called and prior Berkley Harvey is needed on Zepbound 2.5mg /0.64ml.

## 2023-06-19 NOTE — Patient Instructions (Signed)
It was great to see you!  Stop the lomaira and start zepbound injection once a week.  Make sure you are drinking plenty of water.   Let's follow-up in 3 months, sooner if you have concerns.  If a referral was placed today, you will be contacted for an appointment. Please note that routine referrals can sometimes take up to 3-4 weeks to process. Please call our office if you haven't heard anything after this time frame.  Take care,  Rodman Pickle, NP

## 2023-06-19 NOTE — Assessment & Plan Note (Signed)
She is lost 5 pounds since her last visit, congratulated her on this.  Her new insurance covers Zepbound and she would like to switch since the [place] is not decreasing her appetite anymore.  Will have her start Zepbound 2.5 mg injection weekly.  She was on this before and did well, until her insurance stopped covering it.  Follow-up in 3 months or sooner with concerns.

## 2023-06-20 ENCOUNTER — Other Ambulatory Visit (HOSPITAL_COMMUNITY): Payer: Self-pay

## 2023-06-20 NOTE — Telephone Encounter (Signed)
Pharmacy Patient Advocate Encounter  Received notification from [facility] that Prior Authorization for Zepbound 2.5MG /0.5ML pen-injectors has been APPROVED from 06/19/2023 to 12/18/2023. Ran test claim, Copay is $24.99. This test claim was processed through [facility]- copay amounts may vary at other pharmacies due to pharmacy/plan contracts, or as the patient moves through the different stages of their insurance plan.   PA #/Case ID/Reference #: [id number]

## 2023-06-20 NOTE — Telephone Encounter (Signed)
Patient notified of approval. 

## 2023-07-10 ENCOUNTER — Encounter: Payer: Self-pay | Admitting: Nurse Practitioner

## 2023-07-17 MED ORDER — ZEPBOUND 5 MG/0.5ML ~~LOC~~ SOAJ: 5.0000 mg | SUBCUTANEOUS | 1 refills | Status: DC

## 2023-07-17 NOTE — Addendum Note (Signed)
Addended by: Rodman Pickle A on: 07/17/2023 04:20 PM   Modules accepted: Orders

## 2023-07-21 ENCOUNTER — Encounter: Payer: Self-pay | Admitting: Nurse Practitioner

## 2023-07-21 MED ORDER — PROMETHAZINE-DM 6.25-15 MG/5ML PO SYRP: 5.0000 mL | ORAL_SOLUTION | Freq: Four times a day (QID) | ORAL | 0 refills | Status: DC | PRN

## 2023-09-05 ENCOUNTER — Encounter: Payer: Self-pay | Admitting: Nurse Practitioner

## 2023-09-06 MED ORDER — ZEPBOUND 7.5 MG/0.5ML ~~LOC~~ SOAJ: 7.5000 mg | SUBCUTANEOUS | 0 refills | Status: DC

## 2023-09-22 LAB — HM MAMMOGRAPHY

## 2023-09-25 ENCOUNTER — Encounter: Payer: Self-pay | Admitting: Nurse Practitioner

## 2023-09-25 ENCOUNTER — Other Ambulatory Visit (HOSPITAL_COMMUNITY)
Admission: RE | Admit: 2023-09-25 | Discharge: 2023-09-25 | Disposition: A | Discharge status: Home / Self Care | Payer: PRIVATE HEALTH INSURANCE | Source: Ambulatory Visit | Attending: Nurse Practitioner | Admitting: Nurse Practitioner

## 2023-09-25 ENCOUNTER — Ambulatory Visit (INDEPENDENT_AMBULATORY_CARE_PROVIDER_SITE_OTHER): Payer: PRIVATE HEALTH INSURANCE | Admitting: Nurse Practitioner

## 2023-09-25 VITALS — BP 122/84 | HR 92 | Temp 97.7°F | Ht 63.0 in | Wt 220.8 lb

## 2023-09-25 DIAGNOSIS — Z23 Encounter for immunization: Secondary | ICD-10-CM

## 2023-09-25 DIAGNOSIS — E669 Obesity, unspecified: Secondary | ICD-10-CM | POA: Insufficient documentation

## 2023-09-25 DIAGNOSIS — Z113 Encounter for screening for infections with a predominantly sexual mode of transmission: Secondary | ICD-10-CM | POA: Diagnosis present

## 2023-09-25 DIAGNOSIS — E559 Vitamin D deficiency, unspecified: Secondary | ICD-10-CM | POA: Diagnosis not present

## 2023-09-25 DIAGNOSIS — Z6839 Body mass index (BMI) 39.0-39.9, adult: Secondary | ICD-10-CM

## 2023-09-25 DIAGNOSIS — Z0000 Encounter for general adult medical examination without abnormal findings: Secondary | ICD-10-CM | POA: Diagnosis not present

## 2023-09-25 DIAGNOSIS — G43709 Chronic migraine without aura, not intractable, without status migrainosus: Secondary | ICD-10-CM

## 2023-09-25 DIAGNOSIS — M069 Rheumatoid arthritis, unspecified: Secondary | ICD-10-CM

## 2023-09-25 DIAGNOSIS — E538 Deficiency of other specified B group vitamins: Secondary | ICD-10-CM

## 2023-09-25 LAB — COMPREHENSIVE METABOLIC PANEL
ALT: 14 U/L (ref 0–35)
AST: 15 U/L (ref 0–37)
Albumin: 3.6 g/dL (ref 3.5–5.2)
Alkaline Phosphatase: 47 U/L (ref 39–117)
BUN: 10 mg/dL (ref 6–23)
CO2: 25 meq/L (ref 19–32)
Calcium: 8.5 mg/dL (ref 8.4–10.5)
Chloride: 105 meq/L (ref 96–112)
Creatinine, Ser: 0.65 mg/dL (ref 0.40–1.20)
GFR: 107.67 mL/min (ref >60.00)
Glucose, Bld: 73 mg/dL (ref 70–99)
Potassium: 3.5 meq/L (ref 3.5–5.1)
Sodium: 136 meq/L (ref 135–145)
Total Bilirubin: 0.7 mg/dL (ref 0.2–1.2)
Total Protein: 6.8 g/dL (ref 6.0–8.3)

## 2023-09-25 LAB — LIPID PANEL
Cholesterol: 151 mg/dL (ref 0–200)
HDL: 36.1 mg/dL — ABNORMAL LOW (ref >39.00)
LDL Cholesterol: 102 mg/dL — ABNORMAL HIGH (ref 0–99)
NonHDL: 114.62
Total CHOL/HDL Ratio: 4
Triglycerides: 61 mg/dL (ref 0.0–149.0)
VLDL: 12.2 mg/dL (ref 0.0–40.0)

## 2023-09-25 LAB — CBC WITH DIFFERENTIAL/PLATELET
Basophils Absolute: 0 10*3/uL (ref 0.0–0.1)
Basophils Relative: 0.5 % (ref 0.0–3.0)
Eosinophils Absolute: 0.3 10*3/uL (ref 0.0–0.7)
Eosinophils Relative: 3.7 % (ref 0.0–5.0)
HCT: 40.9 % (ref 36.0–46.0)
Hemoglobin: 13.1 g/dL (ref 12.0–15.0)
Lymphocytes Relative: 23.6 % (ref 12.0–46.0)
Lymphs Abs: 2.1 10*3/uL (ref 0.7–4.0)
MCHC: 32.1 g/dL (ref 30.0–36.0)
MCV: 85.3 fl (ref 78.0–100.0)
Monocytes Absolute: 0.9 10*3/uL (ref 0.1–1.0)
Monocytes Relative: 10.2 % (ref 3.0–12.0)
Neutro Abs: 5.5 10*3/uL (ref 1.4–7.7)
Neutrophils Relative %: 62 % (ref 43.0–77.0)
Platelets: 370 10*3/uL (ref 150.0–400.0)
RBC: 4.79 Mil/uL (ref 3.87–5.11)
RDW: 14.4 % (ref 11.5–15.5)
WBC: 8.9 10*3/uL (ref 4.0–10.5)

## 2023-09-25 LAB — VITAMIN D 25 HYDROXY (VIT D DEFICIENCY, FRACTURES): VITD: 39.75 ng/mL (ref 30.00–100.00)

## 2023-09-25 LAB — VITAMIN B12: Vitamin B-12: 333 pg/mL (ref 211–911)

## 2023-09-25 MED ORDER — TOPIRAMATE 25 MG PO TABS: 25.0000 mg | ORAL_TABLET | Freq: Every day | ORAL | 1 refills | Status: DC

## 2023-09-25 NOTE — Assessment & Plan Note (Signed)
Check vitamin D levels and treat based on results.

## 2023-09-25 NOTE — Assessment & Plan Note (Signed)
Check vitamin B12 levels and treat based on results.

## 2023-09-25 NOTE — Assessment & Plan Note (Signed)
Health maintenance reviewed and updated. Discussed nutrition, exercise. Follow-up 1 year.

## 2023-09-25 NOTE — Patient Instructions (Signed)
 It was great to see you!  We are checking your labs today and will let you know the results via mychart/phone.   Keep up the great work!   Let me know if you are ready to go up to 10mg  on zepbound  Let's follow-up in 3 months, sooner if you have concerns.  If a referral was placed today, you will be contacted for an appointment. Please note that routine referrals can sometimes take up to 3-4 weeks to process. Please call our office if you haven't heard anything after this time frame.  Take care,  Rodman Pickle, NP

## 2023-09-25 NOTE — Assessment & Plan Note (Signed)
Chronic, stable.  Will have her continue Topamax 25 mg daily for migraine prevention.

## 2023-09-25 NOTE — Assessment & Plan Note (Addendum)
 Chronic, stable.  She follows with rheumatology and is taking Humira injections and methotrexate.  Continue collaboration recommendations from rheumatology. Will update prevnar 20 today

## 2023-09-25 NOTE — Progress Notes (Signed)
 BP 122/84 (BP Location: Left Arm, Patient Position: Sitting, Cuff Size: Normal)   Pulse 92   Temp 97.7 F (36.5 C)   Ht 5\' 3"  (1.6 m)   Wt 220 lb 12.8 oz (100.2 kg)   SpO2 100%   BMI 39.11 kg/m    Subjective:    Patient ID: Daisy [name], female    DOB: Apr 15, 1981, 43 y.o.   MRN: [id number]  CC: Chief Complaint  Patient presents with   Annual Exam    With lab work    HPI: Daisy [name] is a 43 y.o. female presenting on 09/25/2023 for comprehensive medical examination. Current medical complaints include:none  Depression and Anxiety Screen done today and results listed below:     09/25/2023    8:35 AM 02/10/2023    8:32 AM 01/14/2022    2:24 PM  Depression screen PHQ 2/9  Decreased Interest 0 2 2  Down, Depressed, Hopeless 0 2 0  PHQ - 2 Score 0 4 2  Altered sleeping 0 0 1  Tired, decreased energy 3 2 1   Change in appetite 0 1 1  Feeling bad or failure about yourself  0 1 0  Trouble concentrating 0 0 0  Moving slowly or fidgety/restless 0 0 0  Suicidal thoughts 0 0 0  PHQ-9 Score 3 8 5   Difficult doing work/chores Somewhat difficult Somewhat difficult Not difficult at all      09/25/2023    8:35 AM 02/10/2023    8:32 AM 01/14/2022    2:25 PM  GAD 7 : Generalized Anxiety Score  Nervous, Anxious, on Edge 2 2 3   Control/stop worrying 2 3 3   Worry too much - different things 2 3 3   Trouble relaxing 2 0 3  Restless 0 0 0  Easily annoyed or irritable 0 0 3  Afraid - awful might happen 0 2 3  Total GAD 7 Score 8 10 18   Anxiety Difficulty  Very difficult Very difficult    The patient does not have a history of falls. I did not complete a risk assessment for falls. A plan of care for falls was not documented.   Past Medical History:  Past Medical History:  Diagnosis Date   Allergy    Seasonal allergies   Anemia    Anxiety    Arthritis 11/2013   avascular arthritis left talon oa   Endometriosis    Migraines    Pelvic pain    PONV (postoperative nausea and  vomiting)    Rheumatoid arthritis (HCC)    Tightness of left heel cord 11/2013   Vitamin B12 deficiency    Vitamin D deficiency     Surgical History:  Past Surgical History:  Procedure Laterality Date   ANKLE FUSION Left 11/21/2013   Procedure: LEFT TALONAVICULAR ARTHRODESIS;  Surgeon: Toni Arthurs, MD;  Location: [facility];  Service: Orthopedics;  Laterality: Left;   BILATERAL SALPINGECTOMY Bilateral 04/17/2014   Procedure: BILATERAL SALPINGECTOMY;  Surgeon: Kirkland Hun, MD;  Location: [facility];  Service: Gynecology;  Laterality: Bilateral;   CESAREAN SECTION  11/16/2002   DILATION AND CURETTAGE OF UTERUS     DILITATION & CURRETTAGE/HYSTROSCOPY WITH NOVASURE ABLATION N/A 04/17/2014   Procedure: DILATATION & CURETTAGE/HYSTEROSCOPY WITH NOVASURE ABLATION;  Surgeon: Kirkland Hun, MD;  Location: [facility];  Service: Gynecology;  Laterality: N/A;   GASTROCNEMIUS RECESSION Left 11/21/2013   Procedure: LEFT GASTROC RECESSION;  Surgeon: Toni Arthurs, MD;  Location: [facility];  Service: Orthopedics;  Laterality: Left;   LAPAROSCOPIC OVARIAN CYSTECTOMY Left 04/17/2014   Procedure: LAPAROSCOPIC OVARIAN CYSTECTOMY;  Surgeon: Kirkland Hun, MD;  Location: [facility];  Service: Gynecology;  Laterality: Left;   TUBAL LIGATION  07/27/2005   laparoscopic    Medications:  Current Outpatient Medications on File Prior to Visit  Medication Sig   adalimumab (HUMIRA, 2 PEN,) 40 MG/0.8ML PNKT pen Inject 0.8 mLs (40 mg total) into the skin every 14 (fourteen) days.   albuterol (VENTOLIN HFA) 108 (90 Base) MCG/ACT inhaler INHALE 2 PUFFS INTO THE LUNGS EVERY 4 HOURS AS NEEDED FOR SHORTNESS OF BREATH OR WHEEZING   diclofenac Sodium (VOLTAREN) 1 % GEL Apply 2-4 grams to the affected skin areas four times a day.  Do not exceed a  total dose of 32 g per day over all affected joints. Use the enclosed dosing card to measure the appropriate dose.   ergocalciferol (VITAMIN D2) 1.25 MG (50000 UT)  capsule Take 1 capsule by mouth once a week.   ipratropium (ATROVENT) 0.06 % nasal spray Place 2 sprays into both nostrils 4 (four) times daily.   methotrexate (RHEUMATREX) 2.5 MG tablet Take 6 tablets (15 mg total) by mouth once a week.   norethindrone (AYGESTIN) 5 MG tablet TAKE 1 AND 1/2 TABLETS BY MOUTH DAILY.   tirzepatide (ZEPBOUND) 7.5 MG/0.5ML Pen Inject 7.5 mg into the skin once a week.   triamcinolone cream (KENALOG) 0.1 % Apply 1 Application topically 2 (two) times daily.   valACYclovir (VALTREX) 1000 MG tablet Take 1 tablet (1,000 mg total) by mouth daily as needed (OUTBREAK).   Naproxen Sodium (ALEVE PO) as needed.   No current facility-administered medications on file prior to visit.    Allergies:  Allergies  Allergen Reactions   Codeine Hives and Nausea And Vomiting    ALSO SWEATING AND DIZZINESS   Tomato Hives   Percocet [Oxycodone-Acetaminophen]     Dizziness and jittery   Tramadol Hives    Hives, swelling, dizziness, itching   Latex Rash    Social History:  Social History   Socioeconomic History   Marital status: Single    Spouse name: Not on file   Number of children: Not on file   Years of education: Not on file   Highest education level: Associate degree: academic program  Occupational History   Not on file  Tobacco Use   Smoking status: Former    Current packs/day: 0.00    Average packs/day: 1 pack/day for 4.0 years (4.0 ttl pk-yrs)    Types: Cigarettes    Start date: 2000    Quit date: 2004    Years since quitting: 21.2   Smokeless tobacco: Never   Tobacco comments:    quit smoking 2010  Vaping Use   Vaping status: Never Used  Substance and Sexual Activity   Alcohol use: Yes    Alcohol/week: 4.0 standard drinks of alcohol    Types: 2 Glasses of wine, 2 Standard drinks or equivalent per week    Comment: social   Drug use: No   Sexual activity: Yes    Birth control/protection: Surgical  Other Topics Concern   Not on file  Social History  Narrative   Not on file   Social Drivers of Health   Financial Resource Strain: Low Risk  (09/25/2023)   Overall Financial Resource Strain (CARDIA)    Difficulty of Paying Living Expenses: Not hard at all  Food Insecurity: No Food Insecurity (09/25/2023)   Hunger Vital Sign  Worried About Programme researcher, broadcasting/film/video in the Last Year: Never true    Ran Out of Food in the Last Year: Never true  Transportation Needs: No Transportation Needs (09/25/2023)   PRAPARE - Administrator, Civil Service (Medical): No    Lack of Transportation (Non-Medical): No  Physical Activity: Insufficiently Active (09/25/2023)   Exercise Vital Sign    Days of Exercise per Week: 3 days    Minutes of Exercise per Session: 30 min  Stress: Stress Concern Present (09/25/2023)   [employer] of Occupational Health - Occupational Stress Questionnaire    Feeling of Stress : Rather much  Social Connections: Moderately Isolated (09/25/2023)   Social Connection and Isolation Panel [NHANES]    Frequency of Communication with Friends and Family: More than three times a week    Frequency of Social Gatherings with Friends and Family: Twice a week    Attends Religious Services: More than 4 times per year    Active Member of [employer] or Organizations: No    Attends Engineer, structural: Not on file    Marital Status: Never married  Catering manager Violence: Not on file   Social History   Tobacco Use  Smoking Status Former   Current packs/day: 0.00   Average packs/day: 1 pack/day for 4.0 years (4.0 ttl pk-yrs)   Types: Cigarettes   Start date: 2000   Quit date: 2004   Years since quitting: 21.2  Smokeless Tobacco Never  Tobacco Comments   quit smoking 2010   Social History   Substance and Sexual Activity  Alcohol Use Yes   Alcohol/week: 4.0 standard drinks of alcohol   Types: 2 Glasses of wine, 2 Standard drinks or equivalent per week   Comment: social    Family History:  Family History   Problem Relation Age of Onset   COPD Mother    Cancer Mother        lung   Cancer Father        throat/mouth   Asthma Daughter    ADD / ADHD Daughter    Asthma Son    Cancer Paternal Uncle        prostate   Cancer Paternal Uncle    Breast cancer Neg Hx     Past medical history, surgical history, medications, allergies, family history and social history reviewed with patient today and changes made to appropriate areas of the chart.   Review of Systems  Constitutional: Negative.   HENT: Negative.    Eyes: Negative.   Respiratory: Negative.    Cardiovascular: Negative.   Gastrointestinal: Negative.   Genitourinary: Negative.   Musculoskeletal: Negative.   Skin: Negative.   Neurological: Negative.   Psychiatric/Behavioral: Negative.     All other ROS negative except what is listed above and in the HPI.      Objective:    BP 122/84 (BP Location: Left Arm, Patient Position: Sitting, Cuff Size: Normal)   Pulse 92   Temp 97.7 F (36.5 C)   Ht 5\' 3"  (1.6 m)   Wt 220 lb 12.8 oz (100.2 kg)   SpO2 100%   BMI 39.11 kg/m   Wt Readings from Last 3 Encounters:  09/25/23 220 lb 12.8 oz (100.2 kg)  06/19/23 236 lb (107 kg)  02/10/23 241 lb (109.3 kg)    Physical Exam Vitals and nursing note reviewed.  Constitutional:      General: She is not in acute distress.    Appearance: Normal appearance.  HENT:     Head: Normocephalic and atraumatic.     Right Ear: Tympanic membrane, ear canal and external ear normal.     Left Ear: Tympanic membrane, ear canal and external ear normal.  Eyes:     Conjunctiva/sclera: Conjunctivae normal.  Cardiovascular:     Rate and Rhythm: Normal rate and regular rhythm.     Pulses: Normal pulses.     Heart sounds: Normal heart sounds.  Pulmonary:     Effort: Pulmonary effort is normal.     Breath sounds: Normal breath sounds.  Abdominal:     Palpations: Abdomen is soft.     Tenderness: There is no abdominal tenderness.  Musculoskeletal:         General: Normal range of motion.     Cervical back: Normal range of motion and neck supple.     Right lower leg: No edema.     Left lower leg: No edema.  Lymphadenopathy:     Cervical: No cervical adenopathy.  Skin:    General: Skin is warm and dry.  Neurological:     General: No focal deficit present.     Mental Status: She is alert and oriented to person, place, and time.     Cranial Nerves: No cranial nerve deficit.     Coordination: Coordination normal.     Gait: Gait normal.  Psychiatric:        Mood and Affect: Mood normal.        Behavior: Behavior normal.        Thought Content: Thought content normal.        Judgment: Judgment normal.     Results for orders placed or performed in visit on 02/10/23  VITAMIN D 25 Hydroxy (Vit-D Deficiency, Fractures)   Collection Time: 02/10/23  8:56 AM  Result Value Ref Range   VITD 16.33 (L) 30.00 - 100.00 ng/mL      Assessment & Plan:   Problem List Items Addressed This Visit       Cardiovascular and Mediastinum   Migraines   Chronic, stable.  Will have her continue Topamax 25 mg daily for migraine prevention.      Relevant Medications   topiramate (TOPAMAX) 25 MG tablet     Musculoskeletal and Integument   Rheumatoid arthritis (HCC)   Chronic, stable.  She follows with rheumatology and is taking Humira injections and methotrexate.  Continue collaboration recommendations from rheumatology. Will update prevnar 20 today      Relevant Orders   CBC with Differential/Platelet   Comprehensive metabolic panel   Pneumococcal conjugate vaccine 20-valent     Other   Vitamin D deficiency   Check vitamin D levels and treat based on results.       Relevant Orders   VITAMIN D 25 Hydroxy (Vit-D Deficiency, Fractures)   Vitamin B12 deficiency   Check vitamin B12 levels and treat based on results.       Relevant Orders   Vitamin B12   Obesity (BMI 30-39.9)   She has lost 16 pounds since her last visit! Congratulated her  on this. Continue zepbound 7.5mg  injection weekly and go up to 10mg  injection weekly if tolerating. Continue focus on nutrition and exercise.       Relevant Orders   Lipid panel   Routine general medical examination at a health care facility - Primary   Health maintenance reviewed and updated. Discussed nutrition, exercise. Follow-up 1 year.         Other Visit Diagnoses  Screen for STD (sexually transmitted disease)       Screen STD today   Relevant Orders   HIV Antibody (routine testing w rflx)   Urine cytology ancillary only     Immunization due       Prevnar 20 given today with history of RA on medication   Relevant Orders   Pneumococcal conjugate vaccine 20-valent        Follow up plan: Return in about 3 months (around 12/26/2023) for weight.   LABORATORY TESTING:  - Pap smear: up to date  IMMUNIZATIONS:   - Tdap: Tetanus vaccination status reviewed: last tetanus booster within 10 years. - Influenza: Up to date - Pneumovax: Not applicable - Prevnar: Administered today - HPV: Not applicable - Shingrix vaccine: Not applicable  SCREENING: -Mammogram: Done elsewhere  - Colonoscopy: Not applicable  - Bone Density: Not applicable   PATIENT COUNSELING:   Advised to take 1 mg of folate supplement per day if capable of pregnancy.   Sexuality: Discussed sexually transmitted diseases, partner selection, use of condoms, avoidance of unintended pregnancy  and contraceptive alternatives.   Advised to avoid cigarette smoking.  I discussed with the patient that most people either abstain from alcohol or drink within safe limits (<=14/week and <=4 drinks/occasion for males, <=7/weeks and <= 3 drinks/occasion for females) and that the risk for alcohol disorders and other health effects rises proportionally with the number of drinks per week and how often a drinker exceeds daily limits.  Discussed cessation/primary prevention of drug use and availability of treatment for  abuse.   Diet: Encouraged to adjust caloric intake to maintain  or achieve ideal body weight, to reduce intake of dietary saturated fat and total fat, to limit sodium intake by avoiding high sodium foods and not adding table salt, and to maintain adequate dietary potassium and calcium preferably from fresh fruits, vegetables, and low-fat dairy products.    stressed the importance of regular exercise  Injury prevention: Discussed safety belts, safety helmets, smoke detector, smoking near bedding or upholstery.   Dental health: Discussed importance of regular tooth brushing, flossing, and dental visits.    NEXT PREVENTATIVE PHYSICAL DUE IN 1 YEAR. Return in about 3 months (around 12/26/2023) for weight.  Jaquanda Wickersham A Britania Shreeve

## 2023-09-25 NOTE — Assessment & Plan Note (Signed)
 She has lost 16 pounds since her last visit! Congratulated her on this. Continue zepbound 7.5mg  injection weekly and go up to 10mg  injection weekly if tolerating. Continue focus on nutrition and exercise.

## 2023-09-26 LAB — URINE CYTOLOGY ANCILLARY ONLY
Chlamydia: NEGATIVE
Comment: NEGATIVE
Comment: NORMAL
Neisseria Gonorrhea: NEGATIVE

## 2023-09-26 LAB — HIV ANTIBODY (ROUTINE TESTING W REFLEX): HIV 1&2 Ab, 4th Generation: NONREACTIVE

## 2023-10-16 ENCOUNTER — Encounter: Payer: Self-pay | Admitting: Nurse Practitioner

## 2023-11-28 ENCOUNTER — Telehealth: Payer: Self-pay | Admitting: Pharmacy Technician

## 2023-11-28 ENCOUNTER — Other Ambulatory Visit (HOSPITAL_COMMUNITY): Payer: Self-pay

## 2023-11-28 NOTE — Telephone Encounter (Signed)
 Pharmacy Patient Advocate Encounter   Received notification from Onbase that prior authorization for ZEPBOUND  2.5MG /0.5ML AUTO-INJECTORS is required/requested for renewal.    Insurance verification completed.   The patient is insured through [facility] .   Per test claim: PATIENT NO LONGER COVERED BY PLAN. PER CHART PATIENT IS CURRENTLY UNINSURED AND SHE IS AWARE.

## 2023-12-26 ENCOUNTER — Ambulatory Visit: Payer: PRIVATE HEALTH INSURANCE | Admitting: Nurse Practitioner

## 2024-01-09 ENCOUNTER — Encounter: Payer: Self-pay | Admitting: Nurse Practitioner

## 2024-01-11 NOTE — Telephone Encounter (Signed)
 I called patient and no answer and left a voice message to return call. I also sent patient a Mychart message to return call.

## 2024-01-11 NOTE — Telephone Encounter (Signed)
 I called and spoke with patient and she is feeling better now. She believes it may have been her allergies, No questions or concerns. Daisy [name] did mention that she has been off all of her medicines.

## 2024-01-26 DIAGNOSIS — Z79899 Other long term (current) drug therapy: Secondary | ICD-10-CM | POA: Diagnosis not present

## 2024-02-06 ENCOUNTER — Other Ambulatory Visit (HOSPITAL_COMMUNITY): Payer: Self-pay

## 2024-02-06 ENCOUNTER — Telehealth: Payer: Self-pay

## 2024-02-06 ENCOUNTER — Ambulatory Visit: Payer: PRIVATE HEALTH INSURANCE | Admitting: Nurse Practitioner

## 2024-02-06 ENCOUNTER — Encounter: Payer: Self-pay | Admitting: Nurse Practitioner

## 2024-02-06 VITALS — BP 122/84 | HR 79 | Temp 97.7°F | Ht 63.0 in | Wt 228.0 lb

## 2024-02-06 DIAGNOSIS — M069 Rheumatoid arthritis, unspecified: Secondary | ICD-10-CM | POA: Diagnosis not present

## 2024-02-06 DIAGNOSIS — G43709 Chronic migraine without aura, not intractable, without status migrainosus: Secondary | ICD-10-CM

## 2024-02-06 DIAGNOSIS — E559 Vitamin D deficiency, unspecified: Secondary | ICD-10-CM | POA: Diagnosis not present

## 2024-02-06 DIAGNOSIS — R5383 Other fatigue: Secondary | ICD-10-CM

## 2024-02-06 MED ORDER — VALACYCLOVIR HCL 1 G PO TABS: ORAL_TABLET | ORAL | 3 refills | Status: AC

## 2024-02-06 MED ORDER — TOPIRAMATE 25 MG PO TABS: 25.0000 mg | ORAL_TABLET | Freq: Every day | ORAL | 1 refills | Status: DC

## 2024-02-06 MED ORDER — NORETHINDRONE ACETATE 5 MG PO TABS: 7.5000 mg | ORAL_TABLET | Freq: Every day | ORAL | 3 refills | Status: AC

## 2024-02-06 MED ORDER — ZEPBOUND 2.5 MG/0.5ML ~~LOC~~ SOAJ: 2.5000 mg | SUBCUTANEOUS | 0 refills | Status: DC

## 2024-02-06 NOTE — Patient Instructions (Signed)
 It was great to see you!  When you finish the prescription vitamin D , start vitamin D  over the counter 2,000 units daily   Start zepbound  injection once a week, let me know when you need a refill and we can go up on it   Let's follow-up in 3 months, sooner if you have concerns.  If a referral was placed today, you will be contacted for an appointment. Please note that routine referrals can sometimes take up to 3-4 weeks to process. Please call our office if you haven't heard anything after this time frame.  Take care,  Tinnie Harada, NP

## 2024-02-06 NOTE — Assessment & Plan Note (Signed)
 BMI 40.3. Weight gain followed the discontinuation of Zepbound . Start Zepbound  at 2.5 mg and titrate up as needed, notifying when ready to increase the dose. Advise using backup contraception for one month when restarting Zepbound  due to interaction with norethindrone . Continue focusing on limiting portion sizes and using the plate method of eating with 1/2 vegetables, 1/4 protein, and 1/4 carbs. Follow-up in 3 months.

## 2024-02-06 NOTE — Progress Notes (Signed)
 Established Patient Office Visit  Subjective   Patient ID: Daisy [name], female    DOB: 10-01-1980  Age: 43 y.o. MRN: [id number]  Chief Complaint  Patient presents with   Weight Management    Follow up, Rx Refills    HPI Discussed the use of AI scribe software for clinical note transcription with the patient, who gave verbal consent to proceed.  History of Present Illness   Daisy [name] is a 43 year old female with arthritis who presents for medication management after a lapse in insurance coverage.  She has been off her arthritis medications, including methotrexate  and Humira , for three months due to a lapse in insurance, resulting in unexpected flares. She is now restarting these medications as they have arrived. During this period, she gained eight pounds since being off the zepbound . She has still been trying to eat smaller portions and is active at work.   She experiences fatigue despite adequate sleep, with no snoring and no history of sleep apnea testing. Recent labs, including vitamin D  levels, are normal with ongoing supplementation.   Her nutrition includes three meals a day with snacks, adequate water intake, and consistent portion sizes, including fruits and vegetables.        ROS See pertinent positives and negatives per HPI.    Objective:     BP 122/84 (BP Location: Left Arm, Patient Position: Sitting, Cuff Size: Normal)   Pulse 79   Temp 97.7 F (36.5 C)   Ht 5' 3 (1.6 m)   Wt 228 lb (103.4 kg)   SpO2 100%   BMI 40.39 kg/m  BP Readings from Last 3 Encounters:  02/06/24 122/84  09/25/23 122/84  06/19/23 116/82   Wt Readings from Last 3 Encounters:  02/06/24 228 lb (103.4 kg)  09/25/23 220 lb 12.8 oz (100.2 kg)  06/19/23 236 lb (107 kg)      Physical Exam Vitals and nursing note reviewed.  Constitutional:      General: She is not in acute distress.    Appearance: Normal appearance.  HENT:     Head: Normocephalic.  Eyes:      Conjunctiva/sclera: Conjunctivae normal.  Cardiovascular:     Rate and Rhythm: Normal rate and regular rhythm.     Pulses: Normal pulses.     Heart sounds: Normal heart sounds.  Pulmonary:     Effort: Pulmonary effort is normal.     Breath sounds: Normal breath sounds.  Musculoskeletal:     Cervical back: Normal range of motion.  Skin:    General: Skin is warm.  Neurological:     General: No focal deficit present.     Mental Status: She is alert and oriented to person, place, and time.  Psychiatric:        Mood and Affect: Mood normal.        Behavior: Behavior normal.        Thought Content: Thought content normal.        Judgment: Judgment normal.     The 10-year ASCVD risk score (Arnett DK, et al., 2019) is: 1.3%    Assessment & Plan:   Problem List Items Addressed This Visit       Cardiovascular and Mediastinum   Migraines - Primary   Chronic, stable. Continue topamax  25mg  at bedtime. Refill sent to the pharmacy.       Relevant Medications   topiramate  (TOPAMAX ) 25 MG tablet     Musculoskeletal and Integument   Rheumatoid arthritis (  HCC)   Chronic, ongoing. Flares occurred after stopping methotrexate  and Humira  due to insurance issues. Symptoms are flaring again, but medications have arrived. Restart methotrexate  and Humira  as previously prescribed by rheumatology.        Other   Vitamin D  deficiency   Levels have improved with prescription vitamin D . Transition to over-the-counter vitamin D  2000 units daily after finishing the prescription.      Morbid obesity (HCC)   BMI 40.3. Weight gain followed the discontinuation of Zepbound . Start Zepbound  at 2.5 mg and titrate up as needed, notifying when ready to increase the dose. Advise using backup contraception for one month when restarting Zepbound  due to interaction with norethindrone . Continue focusing on limiting portion sizes and using the plate method of eating with 1/2 vegetables, 1/4 protein, and 1/4 carbs.  Follow-up in 3 months.       Relevant Medications   tirzepatide  (ZEPBOUND ) 2.5 MG/0.5ML Pen   Other Visit Diagnoses       Fatigue, unspecified type       Fatigue may be related to stress and lifestyle, with normal lab results. Discussed potential causes and the option of a home sleep study if fatigue persists.       Return in about 3 months (around 05/08/2024) for weight management .    Tinnie DELENA Harada, NP

## 2024-02-06 NOTE — Telephone Encounter (Signed)
 Can we get a prior auth on Tirzepatide  2.5mg /0.20ml?

## 2024-02-06 NOTE — Assessment & Plan Note (Addendum)
 Chronic, ongoing. Flares occurred after stopping methotrexate  and Humira  due to insurance issues. Symptoms are flaring again, but medications have arrived. Restart methotrexate  and Humira  as previously prescribed by rheumatology.

## 2024-02-06 NOTE — Assessment & Plan Note (Signed)
 Chronic, stable. Continue topamax  25mg  at bedtime. Refill sent to the pharmacy.

## 2024-02-06 NOTE — Assessment & Plan Note (Signed)
 Levels have improved with prescription vitamin D . Transition to over-the-counter vitamin D  2000 units daily after finishing the prescription.

## 2024-02-06 NOTE — Telephone Encounter (Signed)
 Pharmacy Patient Advocate Encounter   Received notification from Pt Calls Messages that prior authorization for Zepbound  2.5MG /0.5ML pen-injectors is required/requested.   Insurance verification completed.   The patient is insured through [facility] .   Per test claim: PA required; PA submitted to above mentioned insurance via CoverMyMeds Key/confirmation #/EOC [id number] Status is pending

## 2024-02-06 NOTE — Telephone Encounter (Signed)
 PA request has been Submitted. New Encounter has been or will be created for follow up. For additional info see Pharmacy Prior Auth telephone encounter from 02/06/24.

## 2024-02-07 ENCOUNTER — Other Ambulatory Visit (HOSPITAL_COMMUNITY): Payer: Self-pay

## 2024-02-07 NOTE — Telephone Encounter (Signed)
 Pharmacy Patient Advocate Encounter  Received notification from [facility] that Prior Authorization for Zepbound  2.5MG /0.5ML pen-injectors  has been APPROVED from 02/07/24 to 08/04/24. Ran test claim, Copay is $30. This test claim was processed through [facility]- copay amounts may vary at other pharmacies due to pharmacy/plan contracts, or as the patient moves through the different stages of their insurance plan.   PA #/Case ID/Reference #: [id number]

## 2024-02-15 DIAGNOSIS — Z79899 Other long term (current) drug therapy: Secondary | ICD-10-CM | POA: Diagnosis not present

## 2024-02-15 DIAGNOSIS — M0579 Rheumatoid arthritis with rheumatoid factor of multiple sites without organ or systems involvement: Secondary | ICD-10-CM | POA: Diagnosis not present

## 2024-03-01 ENCOUNTER — Encounter: Payer: Self-pay | Admitting: Nurse Practitioner

## 2024-03-01 MED ORDER — ZEPBOUND 5 MG/0.5ML ~~LOC~~ SOAJ: 5.0000 mg | SUBCUTANEOUS | 1 refills | Status: DC

## 2024-03-08 DIAGNOSIS — M1712 Unilateral primary osteoarthritis, left knee: Secondary | ICD-10-CM | POA: Diagnosis not present

## 2024-05-10 ENCOUNTER — Ambulatory Visit: Admitting: Nurse Practitioner

## 2024-06-11 ENCOUNTER — Other Ambulatory Visit (HOSPITAL_COMMUNITY): Payer: Self-pay

## 2024-06-11 ENCOUNTER — Other Ambulatory Visit: Payer: Self-pay

## 2024-06-28 ENCOUNTER — Telehealth: Payer: Self-pay

## 2024-06-28 ENCOUNTER — Ambulatory Visit: Admitting: Nurse Practitioner

## 2024-06-28 ENCOUNTER — Encounter: Payer: Self-pay | Admitting: Nurse Practitioner

## 2024-06-28 VITALS — BP 118/76 | HR 86 | Temp 97.3°F | Ht 63.0 in | Wt 218.8 lb

## 2024-06-28 DIAGNOSIS — M25562 Pain in left knee: Secondary | ICD-10-CM | POA: Diagnosis not present

## 2024-06-28 DIAGNOSIS — Z23 Encounter for immunization: Secondary | ICD-10-CM

## 2024-06-28 DIAGNOSIS — G8929 Other chronic pain: Secondary | ICD-10-CM | POA: Diagnosis not present

## 2024-06-28 MED ORDER — ZEPBOUND 2.5 MG/0.5ML ~~LOC~~ SOAJ: 2.5000 mg | SUBCUTANEOUS | 1 refills | Status: DC

## 2024-06-28 MED ORDER — NAPROXEN 500 MG PO TABS: 500.0000 mg | ORAL_TABLET | Freq: Two times a day (BID) | ORAL | 0 refills | Status: AC

## 2024-06-28 NOTE — Patient Instructions (Signed)
 It was great to see you!  I have ordered the zepbound , we will see if your insurance covers this   I have refilled the naproxen, but do not take this if you restart methotrexate    Let's follow-up in 3 months, sooner if you have concerns.  If a referral was placed today, you will be contacted for an appointment. Please note that routine referrals can sometimes take up to 3-4 weeks to process. Please call our office if you haven't heard anything after this time frame.  Take care,  Tinnie Harada, NP

## 2024-06-28 NOTE — Progress Notes (Signed)
 Established Patient Office Visit  Subjective   Patient ID: Daisy [name], female    DOB: 18-Sep-1980  Age: 43 y.o. MRN: [id number]  Chief Complaint  Patient presents with   Weight Managment    Follow up    HPI  Discussed the use of AI scribe software for clinical note transcription with the patient, who gave verbal consent to proceed.  History of Present Illness   Daisy [name] is a 43 year old female who presents with knee pain and weight management concerns.  She reports significant knee pain for the past month and has been unable to obtain her prescribed medication due to insurance coverage. She has been taking naproxen from her daughter, which fully relieves the joint pain. She has not taken methotrexate  or Humira  for about three months due to coverage issues.  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  She takes Topamax  for migraines, which are well controlled. She uses norethindrone  intermittently for menstrual pain.  She denies chest pain or shortness of breath. She has not had a flu shot this year. She is up to date on mammogram, last done in March.       ROS See pertinent positives and negatives per HPI.    Objective:     BP 118/76 (BP Location: Left Arm, Patient Position: Sitting, Cuff Size: Normal)   Pulse 86   Temp (!) 97.3 F (36.3 C)   Ht 5' 3 (1.6 m)   Wt 218 lb 12.8 oz (99.2 kg)   SpO2 100%   BMI 38.76 kg/m  BP Readings from Last 3 Encounters:  06/28/24 118/76  02/06/24 122/84  09/25/23 122/84   Wt Readings from Last 3 Encounters:  06/28/24 218 lb 12.8 oz (99.2 kg)  02/06/24 228 lb (103.4 kg)  09/25/23 220 lb 12.8 oz (100.2 kg)      Physical Exam Vitals and nursing note reviewed.  Constitutional:      General: She is not in acute distress.    Appearance: Normal appearance.  HENT:     Head: Normocephalic.  Eyes:      Conjunctiva/sclera: Conjunctivae normal.  Cardiovascular:     Rate and Rhythm: Normal rate and regular rhythm.     Pulses: Normal pulses.     Heart sounds: Normal heart sounds.  Pulmonary:     Effort: Pulmonary effort is normal.     Breath sounds: Normal breath sounds.  Musculoskeletal:     Cervical back: Normal range of motion.  Skin:    General: Skin is warm.  Neurological:     General: No focal deficit present.     Mental Status: She is alert and oriented to person, place, and time.  Psychiatric:        Mood and Affect: Mood normal.        Behavior: Behavior normal.        Thought Content: Thought content normal.        Judgment: Judgment normal.      Results for orders placed or performed in visit on 06/28/24  HM MAMMOGRAPHY  Result Value Ref Range   HM Mammogram 0-4 Bi-Rad 0-4 Bi-Rad, Self Reported Normal      The 10-year ASCVD risk score (Arnett DK, et al., 2019) is: 1.1%    Assessment & Plan:   Problem List Items Addressed This Visit  Other   Chronic pain of left knee - Primary   Her knee pain has significantly improved with naproxen. She is not currently taking methotrexate  due to insurance issues, and naproxen should not be taken with methotrexate . Naproxen was prescribed, 500mg  BID with food, and she was advised against taking it if methotrexate  is restarted.      Relevant Medications   naproxen (NAPROSYN) 500 MG tablet   Morbid obesity (HCC)   BMI 38.7 associated with arthritis. She has lost 10 pounds since July and is engaged in lifestyle modifications, including calorie tracking, walking, and home work-outs. She prefers Zepbound  over semaglutide  due to nausea concerns. Zepbound  2.5mg  weekly was ordered. She should continue lifestyle modifications, including calorie tracking and walking. Follow-up in 3 months.       Relevant Medications   tirzepatide  (ZEPBOUND ) 2.5 MG/0.5ML Pen   Other Visit Diagnoses       Immunization due       Flu  vaccine given today   Relevant Orders   Flu vaccine trivalent PF, 6mos and older(Flulaval,Afluria,Fluarix,Fluzone)       Return in about 3 months (around 09/26/2024) for CPE.    Tinnie DELENA Harada, NP

## 2024-06-28 NOTE — Assessment & Plan Note (Signed)
 Her knee pain has significantly improved with naproxen. She is not currently taking methotrexate  due to insurance issues, and naproxen should not be taken with methotrexate . Naproxen was prescribed, 500mg  BID with food, and she was advised against taking it if methotrexate  is restarted.

## 2024-06-28 NOTE — Assessment & Plan Note (Signed)
 BMI 38.7 associated with arthritis. She has lost 10 pounds since July and is engaged in lifestyle modifications, including calorie tracking, walking, and home work-outs. She prefers Zepbound  over semaglutide  due to nausea concerns. Zepbound  2.5mg  weekly was ordered. She should continue lifestyle modifications, including calorie tracking and walking. Follow-up in 3 months.

## 2024-06-28 NOTE — Telephone Encounter (Signed)
 Can we get a prior auth on tirzepatide (ZEPBOUND) 2.5 MG/0.5ML Pen?

## 2024-07-01 ENCOUNTER — Other Ambulatory Visit (HOSPITAL_COMMUNITY): Payer: Self-pay

## 2024-07-01 ENCOUNTER — Telehealth: Payer: Self-pay

## 2024-07-01 NOTE — Telephone Encounter (Signed)
 Pharmacy Patient Advocate Encounter   Received notification from Pt Calls Messages that prior authorization for  tirzepatide  (ZEPBOUND ) 2.5 MG/0.5ML Pen  is required/requested.   Insurance verification completed.   The patient is insured through [facility].   Per test claim: PA required; PA submitted to above mentioned insurance via Latent Key/confirmation #/EOC [id number] Status is pending

## 2024-07-01 NOTE — Telephone Encounter (Signed)
 Noted. Pending Prior Auth

## 2024-07-02 ENCOUNTER — Other Ambulatory Visit (HOSPITAL_COMMUNITY): Payer: Self-pay

## 2024-07-02 NOTE — Telephone Encounter (Signed)
°  Pharmacy Patient Advocate Encounter  Received notification from [facility] that Prior Authorization for Zepbound  2.5MG /0.5ML pen-injectors has been DENIED.  Full denial letter will be uploaded to the media tab. See denial reason below.   RX Excluded     PA #/Case ID/Reference #: [id number]

## 2024-07-26 ENCOUNTER — Encounter: Payer: Self-pay | Admitting: Nurse Practitioner

## 2024-07-31 ENCOUNTER — Other Ambulatory Visit (HOSPITAL_COMMUNITY): Payer: Self-pay

## 2024-08-01 MED ORDER — WEGOVY 0.25 MG/0.5ML ~~LOC~~ SOAJ: 0.2500 mg | SUBCUTANEOUS | 1 refills | Status: AC

## 2024-08-05 ENCOUNTER — Encounter: Payer: Self-pay | Admitting: Nurse Practitioner

## 2024-08-06 ENCOUNTER — Other Ambulatory Visit (HOSPITAL_COMMUNITY): Payer: Self-pay

## 2024-08-06 ENCOUNTER — Telehealth: Payer: Self-pay

## 2024-08-06 ENCOUNTER — Other Ambulatory Visit: Payer: Self-pay | Admitting: Nurse Practitioner

## 2024-08-06 NOTE — Telephone Encounter (Signed)
 Requesting: TOPIRAMATE  25MG  TABLETS  Last Visit: 06/28/2024 Next Visit: 09/26/2024 Last Refill: 02/06/2024  Please Advise

## 2024-08-06 NOTE — Telephone Encounter (Signed)
 Can we get a prior auth on semaglutide-weight management (WEGOVY) 0.25 MG/0.5ML SOAJ SQ injection?

## 2024-08-06 NOTE — Telephone Encounter (Signed)
 Pharmacy Patient Advocate Encounter   Received notification from RX Request Messages that prior authorization for Wegovy  0.25mg /0.73ml is required/requested.   Insurance verification completed.   The patient is insured through [facility] [employer]ICA[employer].   Per test claim: PA required; PA submitted to above mentioned insurance via Latent Key/confirmation #/EOC [id number] Status is pending

## 2024-08-07 NOTE — Telephone Encounter (Signed)
 Pharmacy Patient Advocate Encounter  Received notification from [employer] that Prior Authorization for Wegovy  0.25mg /0.71ml has been APPROVED from 08/06/24 to 02/03/25   PA #/Case ID/Reference #: [id number]

## 2024-08-12 ENCOUNTER — Other Ambulatory Visit (HOSPITAL_COMMUNITY): Payer: Self-pay

## 2024-08-14 ENCOUNTER — Encounter: Payer: Self-pay | Admitting: Nurse Practitioner

## 2024-08-14 NOTE — Telephone Encounter (Signed)
 Patient sent a Mychart message and updated on medication approval.

## 2024-08-15 ENCOUNTER — Encounter: Payer: Self-pay | Admitting: Nurse Practitioner

## 2024-08-15 ENCOUNTER — Other Ambulatory Visit: Payer: Self-pay

## 2024-08-15 MED ORDER — ONDANSETRON HCL 4 MG PO TABS: 4.0000 mg | ORAL_TABLET | Freq: Three times a day (TID) | ORAL | 1 refills | Status: AC | PRN

## 2024-09-26 ENCOUNTER — Encounter: Admitting: Nurse Practitioner
